# Patient Record
Sex: Female | Born: 1960
Health system: Southern US, Community
[De-identification: ages and names within clinical notes are randomized; demographics above are authoritative.]

## PROBLEM LIST (undated history)

## (undated) DIAGNOSIS — K219 Gastro-esophageal reflux disease without esophagitis: Secondary | ICD-10-CM

## (undated) DIAGNOSIS — E785 Hyperlipidemia, unspecified: Secondary | ICD-10-CM

## (undated) DIAGNOSIS — N02B9 Other recurrent and persistent immunoglobulin A nephropathy: Secondary | ICD-10-CM

## (undated) DIAGNOSIS — N028 Recurrent and persistent hematuria with other morphologic changes: Secondary | ICD-10-CM

## (undated) DIAGNOSIS — M1990 Unspecified osteoarthritis, unspecified site: Secondary | ICD-10-CM

## (undated) DIAGNOSIS — Z860100 Personal history of colon polyps, unspecified: Secondary | ICD-10-CM

## (undated) DIAGNOSIS — O24419 Gestational diabetes mellitus in pregnancy, unspecified control: Secondary | ICD-10-CM

## (undated) DIAGNOSIS — I10 Essential (primary) hypertension: Secondary | ICD-10-CM

## (undated) HISTORY — DX: Personal history of colonic polyps: Z86.010

## (undated) HISTORY — DX: Unspecified osteoarthritis, unspecified site: M19.90

## (undated) HISTORY — DX: Hyperlipidemia, unspecified: E78.5

## (undated) HISTORY — DX: Personal history of colon polyps, unspecified: Z86.0100

## (undated) HISTORY — PX: COLONOSCOPY: SHX174

## (undated) HISTORY — DX: Gastro-esophageal reflux disease without esophagitis: K21.9

## (undated) HISTORY — DX: Recurrent and persistent hematuria with other morphologic changes: N02.8

## (undated) HISTORY — DX: Other recurrent and persistent immunoglobulin A nephropathy: N02.B9

---

## 2010-06-10 ENCOUNTER — Ambulatory Visit (HOSPITAL_COMMUNITY): Admission: RE | Admit: 2010-06-10 | Discharge: 2010-06-10 | Payer: Self-pay

## 2011-09-03 ENCOUNTER — Encounter: Payer: Self-pay | Admitting: Family Medicine

## 2011-09-03 ENCOUNTER — Emergency Department (INDEPENDENT_AMBULATORY_CARE_PROVIDER_SITE_OTHER)
Admission: EM | Admit: 2011-09-03 | Discharge: 2011-09-03 | Disposition: A | Discharge status: Home / Self Care | Payer: 59 | Source: Home / Self Care | Attending: Family Medicine | Admitting: Family Medicine

## 2011-09-03 ENCOUNTER — Encounter: Payer: Self-pay | Admitting: *Deleted

## 2011-09-03 DIAGNOSIS — M940 Chondrocostal junction syndrome [Tietze]: Secondary | ICD-10-CM

## 2011-09-03 DIAGNOSIS — J069 Acute upper respiratory infection, unspecified: Secondary | ICD-10-CM

## 2011-09-03 HISTORY — DX: Gestational diabetes mellitus in pregnancy, unspecified control: O24.419

## 2011-09-03 LAB — POCT INFLUENZA A/B
Influenza A, POC: NEGATIVE
Influenza B, POC: NEGATIVE

## 2011-09-03 MED ORDER — AZITHROMYCIN 250 MG PO TABS: ORAL_TABLET | ORAL | Status: AC

## 2011-09-03 MED ORDER — BENZONATATE 200 MG PO CAPS: 200.0000 mg | ORAL_CAPSULE | Freq: Every day | ORAL | Status: AC

## 2011-09-03 NOTE — ED Notes (Signed)
Patient c/o dry cough, body aches, HA and fatigue x 2 days. Taken Mucinex and ibuprofen OTC.

## 2011-09-05 NOTE — ED Provider Notes (Signed)
History     CSN: [id number] Arrival date & time: 09/03/2011 12:25 PM   First MD Initiated Contact with Patient 09/03/11 1233      Chief Complaint  Patient presents with  . Cough  . Generalized Body Aches      HPI Comments: Patient complains of 2 day history gradually progressively URI symptoms, but has not had a sore throat  Patient is a 50 y.o. female presenting with cough. The history is provided by the patient.  Cough This is a new problem. The current episode started 2 days ago. The problem occurs every few minutes. The problem has been gradually worsening. The cough is non-productive. The maximum temperature recorded prior to her arrival was 100 to 100.9 F. Associated symptoms include chest pain, chills, sweats, headaches, rhinorrhea, myalgias and wheezing. Pertinent negatives include no ear congestion, no ear pain, no sore throat, no shortness of breath and no eye redness. She is not a smoker.    Past Medical History  Diagnosis Date  . Gestational diabetes     Past Surgical History  Procedure Date  . Cesarean section     Family History  Problem Relation Age of Onset  . Hypertension Father   . Diabetes Father   . Cancer Sister     History  Substance Use Topics  . Smoking status: Never Smoker   . Smokeless tobacco: Not on file  . Alcohol Use: No    OB History    Grav Para Term Preterm Abortions TAB SAB Ect Mult Living                  Review of Systems  Constitutional: Positive for fever, chills, appetite change and fatigue.  HENT: Positive for congestion and rhinorrhea. Negative for ear pain, sore throat, sinus pressure and ear discharge.   Eyes: Negative for redness.  Respiratory: Positive for cough, chest tightness and wheezing. Negative for shortness of breath.   Cardiovascular: Positive for chest pain.  Gastrointestinal: Negative.   Genitourinary: Negative.   Musculoskeletal: Positive for myalgias.  Skin: Negative.   Neurological: Positive for  headaches.    Allergies  Ciprofloxacin  Home Medications   Current Outpatient Rx  Name Route Sig Dispense Refill  . AZITHROMYCIN 250 MG PO TABS  Take 2 tabs today; then begin one tab once daily for 4 more days (Rx void after 09/11/11)  DEA # [id number] 6 each 0  . BENZONATATE 200 MG PO CAPS Oral Take 1 capsule (200 mg total) by mouth at bedtime. Take as needed for cough 12 capsule 0    BP 135/87  Pulse 99  Temp(Src) 98.2 F (36.8 C) (Oral)  Resp 14  Ht 5' (1.524 m)  Wt 137 lb (62.143 kg)  BMI 26.76 kg/m2  SpO2 100%  Physical Exam  Nursing note and vitals reviewed. Constitutional: She is oriented to person, place, and time. She appears well-developed and well-nourished. No distress.  HENT:  Head: Normocephalic.  Right Ear: External ear normal.  Left Ear: External ear normal.  Nose: Mucosal edema and rhinorrhea present. No sinus tenderness.  Mouth/Throat: Oropharynx is clear and moist. No oropharyngeal exudate.  Eyes: Conjunctivae and EOM are normal. Pupils are equal, round, and reactive to light. Right eye exhibits no discharge. Left eye exhibits no discharge.  Neck: Normal range of motion. Neck supple.  Cardiovascular: Normal rate, regular rhythm and normal heart sounds.   Pulmonary/Chest: Effort normal and breath sounds normal. She has no wheezes. She has no rales. She exhibits  tenderness.       Distinct tenderness to palpation over sternum  Abdominal: Soft. Bowel sounds are normal. There is no tenderness.  Musculoskeletal: She exhibits no edema.  Lymphadenopathy:    She has cervical adenopathy.       Right cervical: Posterior cervical adenopathy present.       Left cervical: Posterior cervical adenopathy present.  Neurological: She is alert and oriented to person, place, and time.  Skin: Skin is warm and dry. She is not diaphoretic.    ED Course  Procedures none   Labs Reviewed  POCT INFLUENZA A/B negative      1. Acute upper respiratory infections of  unspecified site   2. Acute costochondritis       MDM  There is no evidence of bacterial infection today.    Treat symptomatically for now:  Increase fluid intake, begin expectorant/decongestant, topical decongestant, saline nasal spray/saline irrigation, cough suppressant at bedtime. If fever/chills persist, or if not improving 5 days begin Z-pack (given Rx to hold). May take Ibuprofen 200mg , 4 tabs every 8 hours with food for sternum tenderness, etc. Followup with PCP if not improving 7 to 10 days.         Donna Christen, MD 09/05/11 1259

## 2012-04-18 ENCOUNTER — Encounter (HOSPITAL_COMMUNITY): Payer: Self-pay | Admitting: *Deleted

## 2012-04-18 ENCOUNTER — Emergency Department (HOSPITAL_COMMUNITY)
Admission: EM | Admit: 2012-04-18 | Discharge: 2012-04-18 | Disposition: A | Discharge status: Home / Self Care | Payer: 59 | Source: Home / Self Care | Attending: Emergency Medicine | Admitting: Emergency Medicine

## 2012-04-18 DIAGNOSIS — T148 Other injury of unspecified body region: Secondary | ICD-10-CM

## 2012-04-18 DIAGNOSIS — W57XXXA Bitten or stung by nonvenomous insect and other nonvenomous arthropods, initial encounter: Secondary | ICD-10-CM

## 2012-04-18 DIAGNOSIS — L089 Local infection of the skin and subcutaneous tissue, unspecified: Secondary | ICD-10-CM

## 2012-04-18 MED ORDER — DOXYCYCLINE HYCLATE 100 MG PO CAPS: 100.0000 mg | ORAL_CAPSULE | Freq: Two times a day (BID) | ORAL | Status: AC

## 2012-04-18 NOTE — ED Notes (Signed)
Pt   Reports       She   May  Have  Been  Bitted  By  Some  Fleas     About  1  Week  Ago      She  Has  A  Spot  On  Her   l    Ankle  As  Well  That  May  Have  Gotten  Infected               She  Reports   She  Has  Been  Applying  Topical        cortizone  And  Anti  Biotics        To  The  Area    Without  Improvement

## 2012-04-18 NOTE — ED Provider Notes (Signed)
Medical screening examination/treatment/procedure(s) were performed by non-physician practitioner and as supervising physician I was immediately available for consultation/collaboration.  Leslee Home, M.D.   Reuben Likes, MD 04/18/12 2121

## 2012-04-18 NOTE — ED Provider Notes (Signed)
History     CSN: [id number]  Arrival date & time 04/18/12  1719   First MD Initiated Contact with Patient 04/18/12 1751      Chief Complaint  Patient presents with  . Rash    (Consider location/radiation/quality/duration/timing/severity/associated sxs/prior treatment) Patient is a 51 y.o. female presenting with rash. The history is provided by the patient. No language interpreter was used.  Rash  This is a new problem. The current episode started more than 1 week ago. The problem has not changed since onset.The problem is associated with nothing. There has been no fever. The rash is present on the right lower leg and left lower leg. The pain is moderate. The pain has been constant since onset. Associated symptoms include itching. She has tried nothing for the symptoms.  Pt thinks she was bitten by fleas.  Pt reports she has an area that is red and swollen now.  Pt worried about spider bites  Past Medical History  Diagnosis Date  . Gestational diabetes     Past Surgical History  Procedure Date  . Cesarean section   . Cesarean section     Family History  Problem Relation Age of Onset  . Hypertension Father   . Diabetes Father   . Cancer Sister     History  Substance Use Topics  . Smoking status: Never Smoker   . Smokeless tobacco: Not on file  . Alcohol Use: No    OB History    Grav Para Term Preterm Abortions TAB SAB Ect Mult Living                  Review of Systems  Skin: Positive for itching and rash.  All other systems reviewed and are negative.    Allergies  Ciprofloxacin  Home Medications   Current Outpatient Rx  Name Route Sig Dispense Refill  . OMEPRAZOLE 40 MG PO CPDR Oral Take 40 mg by mouth daily.    Marland Kitchen DOXYCYCLINE HYCLATE 100 MG PO CAPS Oral Take 1 capsule (100 mg total) by mouth 2 (two) times daily. 20 capsule 0    BP 161/90  Pulse 86  Temp 98.1 F (36.7 C) (Oral)  Resp 18  SpO2 98%  LMP 03/25/2012  Physical Exam  Vitals  reviewed. Constitutional: She is oriented to person, place, and time. She appears well-developed and well-nourished.  Musculoskeletal: She exhibits tenderness.       Multiple small bites,  2 cm round red area left ankle,   Neurological: She is alert and oriented to person, place, and time. She has normal reflexes.  Skin: There is erythema.  Psychiatric: She has a normal mood and affect.    ED Course  Procedures (including critical care time)  Labs Reviewed - No data to display No results found.   1. Insect bite   2. Wound infection       MDM  Pt started on doxycycline,        Lonia Skinner [place], [place] 04/18/12 2033

## 2012-04-18 NOTE — ED Notes (Signed)
Pt  Reports  About  1  Week   Ago         She  May  Have  Been bitten     By  some

## 2014-08-05 DIAGNOSIS — K219 Gastro-esophageal reflux disease without esophagitis: Secondary | ICD-10-CM | POA: Insufficient documentation

## 2014-08-05 DIAGNOSIS — I10 Essential (primary) hypertension: Secondary | ICD-10-CM | POA: Insufficient documentation

## 2014-08-05 DIAGNOSIS — M159 Polyosteoarthritis, unspecified: Secondary | ICD-10-CM | POA: Insufficient documentation

## 2014-11-08 ENCOUNTER — Other Ambulatory Visit: Payer: Self-pay | Admitting: Obstetrics and Gynecology

## 2014-11-08 DIAGNOSIS — R1031 Right lower quadrant pain: Secondary | ICD-10-CM

## 2014-11-12 ENCOUNTER — Other Ambulatory Visit: Payer: 59

## 2014-11-14 ENCOUNTER — Ambulatory Visit (HOSPITAL_COMMUNITY)
Admission: RE | Admit: 2014-11-14 | Discharge: 2014-11-14 | Disposition: A | Discharge status: Home / Self Care | Payer: 59 | Source: Ambulatory Visit | Attending: Obstetrics and Gynecology | Admitting: Obstetrics and Gynecology

## 2014-11-14 ENCOUNTER — Encounter (HOSPITAL_COMMUNITY): Payer: Self-pay

## 2014-11-14 DIAGNOSIS — R1031 Right lower quadrant pain: Secondary | ICD-10-CM | POA: Diagnosis not present

## 2014-11-14 HISTORY — DX: Essential (primary) hypertension: I10

## 2014-11-14 MED ORDER — IOHEXOL 300 MG/ML  SOLN
100.0000 mL | Freq: Once | INTRAMUSCULAR | Status: AC | PRN
  Administered 2014-11-14: 100 mL via INTRAVENOUS

## 2015-04-09 ENCOUNTER — Telehealth: Payer: Self-pay

## 2015-05-01 ENCOUNTER — Other Ambulatory Visit: Payer: Self-pay | Admitting: Family Medicine

## 2015-05-01 ENCOUNTER — Other Ambulatory Visit (HOSPITAL_COMMUNITY)
Admission: RE | Admit: 2015-05-01 | Discharge: 2015-05-01 | Disposition: A | Discharge status: Home / Self Care | Payer: 59 | Source: Ambulatory Visit | Attending: Family Medicine | Admitting: Family Medicine

## 2015-05-01 DIAGNOSIS — Z01419 Encounter for gynecological examination (general) (routine) without abnormal findings: Secondary | ICD-10-CM | POA: Diagnosis not present

## 2015-05-01 DIAGNOSIS — Z1151 Encounter for screening for human papillomavirus (HPV): Secondary | ICD-10-CM | POA: Insufficient documentation

## 2015-05-02 LAB — CYTOLOGY - PAP

## 2015-05-06 ENCOUNTER — Encounter: Payer: Self-pay | Admitting: *Deleted

## 2015-05-06 ENCOUNTER — Other Ambulatory Visit: Payer: Self-pay | Admitting: *Deleted

## 2015-05-06 DIAGNOSIS — Z86010 Personal history of colonic polyps: Secondary | ICD-10-CM | POA: Insufficient documentation

## 2015-05-06 DIAGNOSIS — N028 Recurrent and persistent hematuria with other morphologic changes: Secondary | ICD-10-CM | POA: Insufficient documentation

## 2015-05-06 NOTE — Patient Outreach (Addendum)
[facility]) Care Management   05/06/2015  Rachel Hawkins Jun 29, 1961 [id number]  Rachel Hawkins is an 54 y.o. female who presents for enrollment in the Link To Wellness program for self management assistance with HTN.  Subjective:  Rachel Hawkins has no complaints. She says she is motivated to keep her blood pressure in good control but sometimes forgets to take her medications and is asking for a pill box.  Objective:   Review of Systems  Constitutional: Negative.     Physical Exam  Skin: Skin is warm and dry.  Psychiatric: She has a normal mood and affect. Her behavior is normal. Thought content normal.   Filed Weights   05/06/15 0956  Weight: 137 lb 6.4 oz (62.324 kg)   Filed Vitals:   05/06/15 0956  BP: 110/78  Pulse: 80    Current Medications:   Current Outpatient Prescriptions  Medication Sig Dispense Refill  . lisinopril (PRINIVIL,ZESTRIL) 2.5 MG tablet Take 10 mg by mouth daily.     . norethindrone-ethinyl estradiol (JUNEL FE,GILDESS FE,LOESTRIN FE) 1-20 MG-MCG tablet Take 1 tablet by mouth daily.    Marland Kitchen omeprazole (PRILOSEC) 40 MG capsule Take 40 mg by mouth daily.     No current facility-administered medications for this visit.    Functional Status:   In your present state of health, do you have any difficulty performing the following activities: 05/06/2015  Hearing? N  Vision? N  Difficulty concentrating or making decisions? N  Walking or climbing stairs? N  Dressing or bathing? N  Doing errands, shopping? N    Fall/Depression Screening:    PHQ 2/9 Scores 05/06/2015  PHQ - 2 Score 0   THN CM Care Plan Problem One        Patient Outreach from 05/06/2015 in [place] Problem One  HTN with good control ase evidenced by currently meeting treatment target of <140<90   Care Plan for Problem One  Active   THN Long Term Goal (31-90 days)  Ongoing good control of HTN as evidenced by 75% of home readings and  health care  provider readings meeting target of <140/<90   THN Long Term Goal Start Date  05/06/15   Interventions for Problem One Long Term Goal  provided HTN information packet including BP log sheets, reviewed procedure for home BP monitoring, reviewed heart healthy diet, encouraged Rachel Hawkins to continue her daily walks, reviewed nutritional counseling benefit provided by [place] and encouraged patient to see dietician to assist with dietary management of HTN, provided pill box at no charge to assist with HTN medication adherence, reviewed HTN medication of Lisinopril and discussed mechanism of action, dosage and frequency and common side effects, assigned Emmi education modules related to HTN and home BP monitoring, encouraged patient to view by completion date and to incorporate information gained through the modules to assist with HTN self -management, encouraged patient's ongoing  participation in the [place] [employer]y Rewards Program to assist with healthy lifestyle changes  and to be eligible for cash rewards,  arranged for Link To Wellness follow up  in 6  months    [facility] CM Care Plan Problem Two        Patient Outreach from 05/06/2015 in [place] Problem Two  Abnormal lipid profile as evidenced by elevated triglycerides = 213, and HDL low at 46   Care Plan for Problem Two  Active  Interventions for Problem Two Long Term Goal   reviewed lipid profile drawn 04/30/15, provided information on heart healthy diet and strategies to increase HDL and lower triglycerides, assigned EMMI  appropriate Emmi education modules   THN Long Term Goal (31-90) days  Improved lipid profile as evidenced by triglycerides <200 and HDL >55 at next assessment and pt voices good understanding of heart healthy diet   THN Long Term Goal Start Date  05/06/15     Assessment:  [employer] employee and new Link To Wellness member with HTN meeting treatment targets and with  mildly elevated triglycerides and low HDL.  Plan:  RNCM to fax today's office visit note to Dr. Nancy Fetter. [facility] will meet twice yearly and as needed with patient per Link To Wellness program guidelines to assist with HTN and dyslipidemia self-management and assess patient's progress toward mutually set goals. Barrington Ellison RN,CCM,CDE [place] Management Coordinator Link To Wellness Office Phone [phone] Office Fax [phone]

## 2015-05-07 ENCOUNTER — Encounter: Payer: Self-pay | Admitting: *Deleted

## 2015-05-14 ENCOUNTER — Ambulatory Visit: Payer: Self-pay

## 2015-08-08 ENCOUNTER — Other Ambulatory Visit: Payer: Self-pay | Admitting: *Deleted

## 2015-08-08 NOTE — Patient Outreach (Signed)
Sent the following secure e-mail to [facility] e-mail address: Delight Stare, I hope all is well with you. I wanted to check in with you and see how you were doing with your BP management. Please email me a quick update when you can and include the following: frequency of home BP checks, highest and lowest readings in the last month or so, the average of your readings- (you can get that off of the monitor),  any changes in the medications that you are taking and any changes in you or your family's medical history since our  meeting on 8/1.  Please contact me for any questions or concerns you have. As discussed in our initial meeting, I will contact you about a follow up appointment in February or sooner if you need it. Thanks in advance for this information. Marcie Bal Await response. Barrington Ellison RN,CCM,CDE [place] Management Coordinator Link To Wellness Office Phone [phone] Office Fax [phone]

## 2015-08-13 ENCOUNTER — Other Ambulatory Visit: Payer: Self-pay | Admitting: *Deleted

## 2015-08-13 NOTE — Patient Outreach (Signed)
No response from e-mail sent to patient one week ago requesting update of HTN self management . She was seen for initial Link To Wellness visit on  05/06/2015 Called and left message on home phone as patient does not have a cell phone again requesting update. Await return call. Barrington Ellison RN,CCM,CDE [place] Management Coordinator Link To Wellness Office Phone [phone] Office Fax [phone]

## 2015-08-15 ENCOUNTER — Other Ambulatory Visit: Payer: Self-pay | Admitting: *Deleted

## 2015-08-15 NOTE — Patient Outreach (Signed)
Received return phone call from [place]. She reports home BP reading variance of 137-150/82-94. She says she feels the [employer] home BP monitor reads higher than when her BP is taken manually. She states she is adherent with her medications. She is voicing concern that her resting pulse is in the low 100's at times per the monitor. Discussed limiting caffeine as she admits to consuming a significant amount of caffeine and assess whether this resolves the rapid pulse issue. She says her HTN treatment regimen has not changed and she will see Dr. Nancy Fetter next week. This RNCM suggested she take her home BP monitor to the office so they can check its correlation to the manual check. Vicenta Dunning that this RNCM will contact her in February to arrange routine Link To Wellness follow up.  Barrington Ellison RN,CCM,CDE [place] Management Coordinator Link To Wellness Office Phone [phone] Office Fax [phone]

## 2015-10-22 MED FILL — OMEPRAZOLE DR 40 MG CAPSULE: 40 | 30 days supply | Qty: 30 | Fill #7

## 2015-11-08 ENCOUNTER — Other Ambulatory Visit: Payer: Self-pay | Admitting: *Deleted

## 2015-11-08 NOTE — Patient Outreach (Signed)
Luci Bank a secure e-mail to her [facility] e-mail address requesting she schedule a Link To Wellness follow up appointment to assess HTN self management. Barrington Ellison RN,CCM,CDE [place] Management Coordinator Link To Wellness Office Phone [phone] Office Fax [phone]

## 2015-11-15 ENCOUNTER — Other Ambulatory Visit: Payer: Self-pay | Admitting: *Deleted

## 2015-11-15 NOTE — Patient Outreach (Signed)
Received the following e-mail response from  [facility] regarding HTN self management  "Good Morning, I've been on the run a lot lately  sorry I haven't gotten back with you. I'm going to have my blood pressure & blood sugar tested when [facility] Care Management comes to our office Feb 27th. Is that o k or if I need to come in I will."    Will plan to review screening blood pressure and blood sugar when she is screened by [facility] CM on 2/27 at her work site to determine if Link To Wellness follow up is necessary.  Barrington Ellison RN,CCM,CDE [place] Management Coordinator Link To Wellness Office Phone [phone] Office Fax [phone]

## 2015-11-20 MED FILL — OMEPRAZOLE DR 40 MG CAPSULE: 40 | 30 days supply | Qty: 30 | Fill #8

## 2015-11-20 MED FILL — NORETHIN-ESTRAD-FERR 1-0.02: 1-20 | 84 days supply | Qty: 84 | Fill #1

## 2015-12-05 DIAGNOSIS — J40 Bronchitis, not specified as acute or chronic: Secondary | ICD-10-CM | POA: Insufficient documentation

## 2015-12-06 MED FILL — AZITHROMYCIN 250 MG TABLET: 250 | 5 days supply | Qty: 5 | Fill #0

## 2015-12-12 DIAGNOSIS — H40013 Open angle with borderline findings, low risk, bilateral: Secondary | ICD-10-CM | POA: Diagnosis not present

## 2015-12-12 DIAGNOSIS — H53453 Other localized visual field defect, bilateral: Secondary | ICD-10-CM | POA: Diagnosis not present

## 2015-12-12 DIAGNOSIS — H43812 Vitreous degeneration, left eye: Secondary | ICD-10-CM | POA: Diagnosis not present

## 2015-12-25 MED FILL — OMEPRAZOLE DR 40 MG CAPSULE: 40 | 30 days supply | Qty: 30 | Fill #9

## 2015-12-25 MED FILL — LISINOPRIL 10 MG TABLET: 10 | 90 days supply | Qty: 90 | Fill #1

## 2016-01-31 MED FILL — OMEPRAZOLE DR 40 MG CAPSULE: 40 | 90 days supply | Qty: 90 | Fill #0

## 2016-02-24 DIAGNOSIS — L237 Allergic contact dermatitis due to plants, except food: Secondary | ICD-10-CM | POA: Diagnosis not present

## 2016-02-24 MED FILL — HYDROCORTISONE 1% CREAM: 1 | 14 days supply | Qty: 28 | Fill #0

## 2016-02-24 MED FILL — NORETHIN-ESTRAD-FERR 1-0.02: 1-20 | 84 days supply | Qty: 84 | Fill #2

## 2016-04-17 MED FILL — LISINOPRIL 10 MG TABLET: 10 | 90 days supply | Qty: 90 | Fill #2

## 2016-05-22 MED FILL — NORETHIN-ESTRAD-FERR 1-0.02: 1-20 | 84 days supply | Qty: 84 | Fill #0

## 2016-06-03 MED FILL — OMEPRAZOLE DR 40 MG CAPSULE: 40 | 90 days supply | Qty: 90 | Fill #0

## 2016-06-05 ENCOUNTER — Encounter: Payer: Self-pay | Admitting: Pharmacist

## 2016-06-05 ENCOUNTER — Other Ambulatory Visit: Payer: Self-pay | Admitting: Pharmacist

## 2016-06-05 DIAGNOSIS — Z1231 Encounter for screening mammogram for malignant neoplasm of breast: Secondary | ICD-10-CM | POA: Diagnosis not present

## 2016-06-05 NOTE — Patient Outreach (Signed)
[facility]) Care Management  [place]   06/05/2016  Rachel Hawkins 08-04-1961 [id number]  Subjective: Patient presents today for hypertension follow-up as part of the employer-sponsored Link to Wellness program.  Current hypertension regimen includes lisinopril. Most recent MD follow-up was with Dr Nancy Fetter on 05/01/15.  Patient has a pending appt for ~07/2016.  No med changes or major health changes at this time.   States her mom had a stroke yesterday so she has been stressed out.  She complains since she has been typing more she has some edema in her left arm which sometimes causes her some pain. She takes ibuprofen as needed and arthritis topical cream as needed  Reports most home blood pressure readings being in the 130/80s. Denies dizziness or falls.    Patient reported dietary habits:  Has stopped french fries and only drinks coffee or water on most days.   Patient reported exercise habits: Walks 3-5 days a week for 20-30 minutes and also walks dog for 40 minutes per day. Previously was doing aerobics at home but school started back and she hasn't had time.    Objective:   Vitals:   06/05/16 1350  BP: (!) 150/100  Pulse: 78      Encounter Medications: Outpatient Encounter Prescriptions as of 06/05/2016  Medication Sig  . lisinopril (PRINIVIL,ZESTRIL) 10 MG tablet Take 10 mg by mouth daily.  . norethindrone-ethinyl estradiol (JUNEL FE,GILDESS FE,LOESTRIN FE) 1-20 MG-MCG tablet Take 1 tablet by mouth daily.  Marland Kitchen omeprazole (PRILOSEC) 40 MG capsule Take 40 mg by mouth daily.  . [DISCONTINUED] lisinopril (PRINIVIL,ZESTRIL) 2.5 MG tablet Take 10 mg by mouth daily.    No facility-administered encounter medications on file as of 06/05/2016.     Functional Status: In your present state of health, do you have any difficulty performing the following activities: 06/05/2016  Hearing? N  Vision? N  Difficulty concentrating or making decisions? N  Walking or  climbing stairs? N  Dressing or bathing? N  Doing errands, shopping? N  Some recent data might be hidden    Fall/Depression Screening: PHQ 2/9 Scores 06/05/2016 05/06/2015  PHQ - 2 Score 0 0    Assessment: Hypertension: BP above goal of <140/90 in clinic today but likely due to stress from her mother having a stroke. Home blood pressures are mostly <140/90 per patient report.  Plan: -Discussed heart healthy diet and importance of exercise for cardiovascular health.  -Counseled on medication adherence with her blood pressure medications -Instructed patient to start checking her home blood pressure more consistently at home.  -Recommended that she take Tylenol instead of Ibuprofen as needed for occasional pain due to drug interaction with lisinopril  -Recommended that patient continue to keep appointment with primary care provider next month  Followup with Kelli Churn, RN   Bennye Alm, PharmD [facility] PGY2 Pharmacy Resident [phone]  [facility] CM Care Plan Problem One   Flowsheet Row Most Recent Value  Care Plan Problem One  HTN with good control ase evidenced by currently meeting treatment target of <140<90  Role Documenting the Problem One  Clinical Pharmacist  Care Plan for Problem One  Active  THN Long Term Goal (31-90 days)  Patient will check her blood pressure a couple times a week for the next 90 days  THN Long Term Goal Start Date  08/15/15  Interventions for Problem One Long Term Goal  discussed importance of adherence with her medications and checking her blood pressure at home to have  a more accurate assesment of control.     [facility] CM Care Plan Problem Two   Flowsheet Row Most Recent Value  Care Plan Problem Two  Abnormal lipid profile as evidenced by elevated triglycerides = 213, and HDL low at 46  Role Documenting the Problem Two  Care Management Coordinator  Care Plan for Problem Two  Active  Interventions for Problem Two Long Term Goal   reviewed lipie profile drawn  04/30/15, provided information on heart healthy diet and strategies to increase HDl and lower trigglycerides, assigned EMMI  appropriate Emmi education modules  THN Long Term Goal (31-90) days  Improved lipid profile as evidenced by triglycerides <200 and HDL >55 at next assessment and pt voices good understanding of heart healthy diet  THN Long Term Goal Start Date  05/06/15

## 2016-07-04 DIAGNOSIS — S81011A Laceration without foreign body, right knee, initial encounter: Secondary | ICD-10-CM | POA: Diagnosis not present

## 2016-07-04 DIAGNOSIS — S8001XA Contusion of right knee, initial encounter: Secondary | ICD-10-CM | POA: Diagnosis not present

## 2016-07-23 DIAGNOSIS — M7989 Other specified soft tissue disorders: Secondary | ICD-10-CM | POA: Diagnosis not present

## 2016-07-23 DIAGNOSIS — Z1231 Encounter for screening mammogram for malignant neoplasm of breast: Secondary | ICD-10-CM | POA: Diagnosis not present

## 2016-07-23 DIAGNOSIS — M25461 Effusion, right knee: Secondary | ICD-10-CM | POA: Diagnosis not present

## 2016-07-23 DIAGNOSIS — S8001XA Contusion of right knee, initial encounter: Secondary | ICD-10-CM | POA: Diagnosis not present

## 2016-07-24 DIAGNOSIS — M7989 Other specified soft tissue disorders: Secondary | ICD-10-CM | POA: Diagnosis not present

## 2016-07-27 MED FILL — LISINOPRIL 10 MG TABLET: 10 | 90 days supply | Qty: 90 | Fill #0

## 2016-08-10 DIAGNOSIS — I10 Essential (primary) hypertension: Secondary | ICD-10-CM | POA: Diagnosis not present

## 2016-08-10 DIAGNOSIS — N028 Recurrent and persistent hematuria with other morphologic changes: Secondary | ICD-10-CM | POA: Diagnosis not present

## 2016-08-10 DIAGNOSIS — K219 Gastro-esophageal reflux disease without esophagitis: Secondary | ICD-10-CM | POA: Diagnosis not present

## 2016-08-10 DIAGNOSIS — Z0000 Encounter for general adult medical examination without abnormal findings: Secondary | ICD-10-CM | POA: Diagnosis not present

## 2016-09-25 MED FILL — OMEPRAZOLE DR 40 MG CAPSULE: 40 | 90 days supply | Qty: 90 | Fill #0

## 2016-10-27 DIAGNOSIS — H43812 Vitreous degeneration, left eye: Secondary | ICD-10-CM | POA: Diagnosis not present

## 2016-10-27 DIAGNOSIS — H04123 Dry eye syndrome of bilateral lacrimal glands: Secondary | ICD-10-CM | POA: Diagnosis not present

## 2016-10-27 DIAGNOSIS — H53453 Other localized visual field defect, bilateral: Secondary | ICD-10-CM | POA: Diagnosis not present

## 2016-10-27 DIAGNOSIS — H40013 Open angle with borderline findings, low risk, bilateral: Secondary | ICD-10-CM | POA: Diagnosis not present

## 2016-11-10 MED FILL — LISINOPRIL 10 MG TABLET: 10 | 90 days supply | Qty: 90 | Fill #0

## 2016-11-11 DIAGNOSIS — N390 Urinary tract infection, site not specified: Secondary | ICD-10-CM | POA: Diagnosis not present

## 2016-11-11 MED FILL — SULFAMETHOXAZOLE/TMP DS TAB: 800-160 | 5 days supply | Qty: 10 | Fill #0

## 2017-01-07 ENCOUNTER — Ambulatory Visit: Payer: 59 | Admitting: *Deleted

## 2017-01-07 ENCOUNTER — Ambulatory Visit: Payer: Self-pay | Admitting: *Deleted

## 2017-01-13 DIAGNOSIS — N390 Urinary tract infection, site not specified: Secondary | ICD-10-CM | POA: Diagnosis not present

## 2017-01-13 MED FILL — NITROFURANTOIN MONO-MCR 100: 100 | 7 days supply | Qty: 14 | Fill #0

## 2017-01-15 MED FILL — SULFAMETHOXAZOLE/TMP DS TAB: 800-160 | 5 days supply | Qty: 10 | Fill #0

## 2017-01-21 ENCOUNTER — Other Ambulatory Visit: Payer: Self-pay | Admitting: *Deleted

## 2017-01-22 ENCOUNTER — Encounter: Payer: Self-pay | Admitting: *Deleted

## 2017-01-22 NOTE — Patient Outreach (Signed)
[facility]) Care Management  Rachel Hawkins   01/21/2017  Rachel Hawkins Aug 18, 1961 [id number]  Subjective: Patient presents today for hypertension follow-up as part of the employer-sponsored Link to Wellness program.  Current hypertension regimen includes lisinopril. Most recent MD follow-up was with Dr Nancy Fetter on 11/11/16 for c/o abdominal pain. .  Patient has wellness exam scheduled for 08/11/17 .  No med changes or major health changes at this time.   She takes ibuprofen as needed and arthritis topical cream as needed for arthritis pain  She brings her Omron home blood pressure monitor with her. She reports intermittent episodes of bilateral arm heaviness and says it's usually associated with a low blood pressure reading. She says she gets a headache when her blood pressure is high.     Patient reported exercise habits: Walks 3-5 days a week for 20-30 minutes and also walks dog for 40 minutes per day.    Objective:   Vitals:   01/21/17 1630  BP: 102/78  Pulse: 84   Review of Omron home blood pressure monitor shows average blood pressure of 103/77, variance of 77-151/58-88.   Encounter Medications: Outpatient Encounter Prescriptions as of 01/21/2017  Medication Sig Note  . lisinopril (PRINIVIL,ZESTRIL) 10 MG tablet Take 10 mg by mouth daily.   Marland Kitchen ibuprofen (ADVIL,MOTRIN) 200 MG tablet Take 400 mg by mouth every 6 (six) hours as needed.    No facility-administered encounter medications on file as of 01/21/2017.     Functional Status: In your present state of health, do you have any difficulty performing the following activities: 01/21/2017 06/05/2016  Hearing? N N  Vision? N N  Difficulty concentrating or making decisions? N N  Walking or climbing stairs? N N  Dressing or bathing? N N  Doing errands, shopping? N N  Preparing Food and eating ? N -  Using the Toilet? N -  In the past six months, have you accidently leaked urine? N -  Do you have problems  with loss of bowel control? N -  Managing your Medications? N -  Managing your Finances? N -  Housekeeping or managing your Housekeeping? N -  Some recent data might be hidden    Fall/Depression Screening: PHQ 2/9 Scores 01/21/2017 06/05/2016 05/06/2015  PHQ - 2 Score 0 0 0    Assessment: [place] [employer]  employee with hypertension meeting blood pressure goals of <140/<90. Lipi profile shows elevated triglycerides and Low HDL.     THN CM Care Plan Problem One     Most Recent Value  Care Plan Problem One  HTN with good control as evidenced by average home BP readings meeting treatment target of <140<90  Role Documenting the Problem One Care Management [place] for Problem One  Active  THN Long Term Goal (31-90 days)  Patient will check her blood pressure at least once weekly and when she doesn't feel well and report high or low BP trends to her provider   [facility] Long Term Goal Start Date 01/21/17  Interventions for Problem One Long Term Goal  reviewed medications and medication adherence, discussed importance of adherence with her medications and checking her blood pressure at home to have a more accurate assessment of control, reviewed guidelines that would require MD notification    [facility] CM Care Plan Problem Two     Most Recent Value  Care Plan Problem Two  Abnormal lipid profile as evidenced by elevated triglycerides = 251, and HDL low at 46  Role Documenting the Problem Two Care Management [place] for Problem Two Active  Interventions for Problem Two Long Term Goal  reviewed lipid profile drawn 08/10/16, reviewed information on heart healthy diet and strategies to increase HDL and lower triglycerides  THN Long Term Goal (31-90) days Improved lipid profile as evidenced by triglycerides <200 and HDL >55 at next assessment and pt voices good understanding of heart healthy diet  THN Long Term Goal Start Date 01/21/17     RNCM to fax today's office visit note to Dr.  Nancy Fetter. [facility] will meet twice yearly and as needed with patient per Link To Wellness program guidelines to assist with HTN and dyslipidemia self-management and assess patient's progress toward mutually set goals. Barrington Ellison RN,CCM,CDE [place] Management Coordinator Link To Wellness Office Phone [phone] Office Fax [phone]

## 2017-03-19 MED FILL — LISINOPRIL 10 MG TABLET: 10 | 90 days supply | Qty: 90 | Fill #1

## 2017-06-23 DIAGNOSIS — Z1231 Encounter for screening mammogram for malignant neoplasm of breast: Secondary | ICD-10-CM | POA: Diagnosis not present

## 2017-07-08 DIAGNOSIS — I10 Essential (primary) hypertension: Secondary | ICD-10-CM | POA: Diagnosis not present

## 2017-07-08 DIAGNOSIS — R42 Dizziness and giddiness: Secondary | ICD-10-CM | POA: Diagnosis not present

## 2017-07-08 DIAGNOSIS — R5383 Other fatigue: Secondary | ICD-10-CM | POA: Diagnosis not present

## 2017-07-08 DIAGNOSIS — R350 Frequency of micturition: Secondary | ICD-10-CM | POA: Diagnosis not present

## 2017-07-08 DIAGNOSIS — R319 Hematuria, unspecified: Secondary | ICD-10-CM | POA: Diagnosis not present

## 2017-07-08 MED FILL — SULFAMETHOXAZOLE/TMP DS TAB: 800-160 | 5 days supply | Qty: 10 | Fill #0

## 2017-07-16 ENCOUNTER — Other Ambulatory Visit: Payer: Self-pay | Admitting: *Deleted

## 2017-07-16 NOTE — Patient Outreach (Addendum)
[facility]) Care Management  [place]   07/16/2017  Rachel Hawkins 12/18/1960 [id number]  Subjective: Patient was seen at the [employer] today for hypertension follow-up as part of the employer-sponsored Link to Wellness program. Most recent MD follow-up was with Marilynne Drivers PA  for c/o dizziness and urinary frequency. There was a moderate amount of blood in her urine but the culture was negative, provider attributes blood in urine to IgA nephropathy. Her Lisinopril dosage was reduced due to c/o dizziness and low normal blood pressure readings.   Patient has wellness exam scheduled for 08/11/17.   She takes ibuprofen as needed and arthritis topical cream as needed for arthritis pain  She brings her Omron home blood pressure monitor with her.      Patient reported exercise habits: Walks 3-5 days a week for 20-30 minutes and also walks dog for 40 minutes per day.    Objective:  BP taken at the [place] [facility]= 120/64. Reviewed Omron home monitor blood pressure readings. There were several readings <90/<60 and no readings >140/>90. Marland Kitchen   Encounter Medications: Outpatient Encounter Prescriptions as of 01/21/2017  Medication Sig Note  . lisinopril (PRINIVIL,ZESTRIL) 10 MG tablet Take 10 mg by mouth daily.   Marland Kitchen ibuprofen (ADVIL,MOTRIN) 200 MG tablet Take 400 mg by mouth every 6 (six) hours as needed.    No facility-administered encounter medications on file as of 01/21/2017.     Functional Status: In your present state of health, do you have any difficulty performing the following activities: 01/21/2017  Hearing? N  Vision? N  Difficulty concentrating or making decisions? N  Walking or climbing stairs? N  Dressing or bathing? N  Doing errands, shopping? N  Preparing Food and eating ? N  Using the Toilet? N  In the past six months, have you accidently leaked urine? N  Do you have problems with loss of bowel control? N  Managing your  Medications? N  Managing your Finances? N  Housekeeping or managing your Housekeeping? N  Some recent data might be hidden    Fall/Depression Screening: PHQ 2/9 Scores 01/21/2017 06/05/2016 05/06/2015  PHQ - 2 Score 0 0 0    Assessment: [employer]  employee with hypertension meeting blood pressure goals of <140/<90. Medication dose recently reduced secondary to low normal blood pressure readings with accompanying dizziness. . Lipid profile shows elevated triglycerides and Low HDL.     THN CM Care Plan Problem One     Most Recent Value  Care Plan Problem One  HTN with good control as evidenced by currently meeting treatment target of <140<90  Role Documenting the Problem One  Care Management [place] for Problem One  Active  [facility] Long Term Goal   Patient will check her blood pressure a couple times a week for the next 90 days  THN Long Term Goal Start Date  08/15/15  Interventions for Problem One Long Term Goal  discussed importance of adherence with her medications and checking her blood pressure at home to have a more accurate assessment of control.     THN CM Care Plan Problem Two     Most Recent Value  Care Plan Problem Two  Abnormal lipid profile as evidenced by elevated triglycerides = 251, and HDL low at 46  Role Documenting the Problem Two  Care Management [place] for Problem Two  Active  Interventions for Problem Two Long Term Goal  reviewed lipid profile drawn 08/10/16, reviewed information on heart healthy diet and strategies to increase HDL and lower triglycerides  THN Long Term Goal  Improved lipid profile as evidenced by triglycerides <200 and HDL >55 at next assessment and pt voices good understanding of heart healthy diet  THN Long Term Goal Start Date  05/06/15    [facility] CM Care Plan Problem Three     Most Recent Value  Care Plan Problem Three  Impaired fasting  glucose on labs done accompanied by symptoms of UTI  Role Documenting the Problem  Three  Care Management Coordinator  Care Plan for Problem Three  Active  THN CM Short Term Goal #1   Sharyn Lull will request Hgb A1C from provider and if indicative of prediabetes - she will enroll in the prediabetes [facility] program  [facility] CM Short Term Goal #1 Start Date  07/16/17  Interventions for Short Term Goal #1 reviewed results of CMET drawn on 10/4 and sent secure e-mail to [facility] advising her of normal values for fasting and nonfasting glucose, suggested she ask her provider to check a Hgb A1C  and report results to this RNCM     RNCM to fax today's office visit note to Dr. Nancy Fetter. [facility] will meet twice yearly and as needed with patient per Link To Wellness program guidelines to assist with HTN and dyslipidemia self-management and assess patient's progress toward mutually set goals. Barrington Ellison RN,CCM,CDE [place] Management Coordinator Link To Wellness Office Phone [phone] Office Fax [phone]

## 2017-08-11 DIAGNOSIS — E559 Vitamin D deficiency, unspecified: Secondary | ICD-10-CM | POA: Diagnosis not present

## 2017-08-11 DIAGNOSIS — R7301 Impaired fasting glucose: Secondary | ICD-10-CM | POA: Diagnosis not present

## 2017-08-11 DIAGNOSIS — Z0000 Encounter for general adult medical examination without abnormal findings: Secondary | ICD-10-CM | POA: Diagnosis not present

## 2017-08-11 DIAGNOSIS — K219 Gastro-esophageal reflux disease without esophagitis: Secondary | ICD-10-CM | POA: Diagnosis not present

## 2017-08-11 DIAGNOSIS — I10 Essential (primary) hypertension: Secondary | ICD-10-CM | POA: Diagnosis not present

## 2017-08-11 DIAGNOSIS — Z1159 Encounter for screening for other viral diseases: Secondary | ICD-10-CM | POA: Diagnosis not present

## 2017-08-11 DIAGNOSIS — N028 Recurrent and persistent hematuria with other morphologic changes: Secondary | ICD-10-CM | POA: Diagnosis not present

## 2017-08-12 ENCOUNTER — Other Ambulatory Visit: Payer: Self-pay | Admitting: *Deleted

## 2017-08-12 MED FILL — SIMVASTATIN 20 MG TABLET: 20 | 30 days supply | Qty: 30 | Fill #0

## 2017-08-12 MED FILL — VIT D2 1.25 MG (50,000 UNIT: 1.25 MG | 28 days supply | Qty: 4 | Fill #0

## 2017-08-12 NOTE — Patient Outreach (Signed)
Received e-mail from Rachel Hawkins stating she saw her provider yesterday and was started on a statin because her cholesterol remains high and she was told she has prediabetes. She requested information about strategies to lower cholesterol and prevent progression of prediabetes to type II Diabetes. This RNCM emailed her an information packet on controlling cholesterol and suggested she view [facility]i education modules on prediabetes and high cholesterol. Also advise Rachel Hawkins that ongoing disease self management assistance will be provided by [facility] or [facility] beginning 10/05/17 and that she will be receiving a letter in the mail with details regarding the transition.  Rachel Hawkins expressed her appreciation to this RNCM for the help provided to her through the Link To Wellness program.. Barrington Ellison RN,CCM,CDE [place] Management Coordinator Link To Wellness and [employer] [phone] Office Fax [phone]

## 2017-09-03 MED FILL — VIT D2 1.25 MG (50,000 UNIT: 1.25 MG | 28 days supply | Qty: 4 | Fill #1

## 2017-09-10 MED FILL — SIMVASTATIN 20 MG TABLET: 20 | 30 days supply | Qty: 30 | Fill #1

## 2017-09-17 MED FILL — LISINOPRIL 10 MG TABLET: 10 | 90 days supply | Qty: 45 | Fill #0

## 2017-10-06 MED FILL — OMEPRAZOLE DR 40 MG CAPSULE: 40 | 90 days supply | Qty: 90 | Fill #0

## 2017-10-19 MED FILL — SIMVASTATIN 20 MG TABLET: 20 | 90 days supply | Qty: 90 | Fill #0

## 2017-11-25 ENCOUNTER — Telehealth: Payer: Self-pay | Admitting: Internal Medicine

## 2017-11-25 NOTE — Telephone Encounter (Signed)
Received referral for patient to be seen for change in BM and recall colon. Last colon was @[facility] in [facility]. Patient not requesting certain. Previous records faxed and placed on DOD AM for 2.1.19; Dr.Pyrtle's desk for review.

## 2017-12-10 NOTE — Telephone Encounter (Signed)
Dr. Hilarie Fredrickson reviewed records and has accepted patient. Ok to schedule Direct Colon. Patient is requesting a Friday morning in June. Will call patient when that schedule is available.

## 2017-12-21 ENCOUNTER — Encounter: Payer: Self-pay | Admitting: Internal Medicine

## 2017-12-31 MED FILL — LISINOPRIL 10 MG TABLET: 10 | 90 days supply | Qty: 45 | Fill #1

## 2018-02-03 MED FILL — SIMVASTATIN 20 MG TABLET: 20 | 90 days supply | Qty: 90 | Fill #1

## 2018-03-07 NOTE — Telephone Encounter (Signed)
Patient cancelled appointments. Records will be in "records reviewed" folder. °

## 2018-03-15 MED FILL — OMEPRAZOLE 40 MG CPDR: 40 | 90 days supply | Qty: 90 | Fill #1

## 2018-03-18 ENCOUNTER — Encounter: Payer: 59 | Admitting: Internal Medicine

## 2018-03-25 ENCOUNTER — Encounter: Payer: 59 | Admitting: Internal Medicine

## 2018-04-15 MED FILL — LISINOPRIL 10 MG TABLET: 10 | 90 days supply | Qty: 45 | Fill #2

## 2018-05-16 MED FILL — SULFAMETHOXAZOLE-TMP DS TAB: 800-160 | 5 days supply | Qty: 10 | Fill #0

## 2018-05-24 MED FILL — SIMVASTATIN 20 MG TABLET: 20 | 90 days supply | Qty: 90 | Fill #2

## 2018-07-26 MED FILL — LISINOPRIL 10 MG TABLET: 10 | 90 days supply | Qty: 45 | Fill #3

## 2018-08-05 MED FILL — AMOXICILLIN 500 MG CAPSULE: 500 | 14 days supply | Qty: 28 | Fill #0

## 2018-09-12 ENCOUNTER — Ambulatory Visit (HOSPITAL_COMMUNITY)
Admission: EM | Admit: 2018-09-12 | Discharge: 2018-09-12 | Disposition: A | Discharge status: Home / Self Care | Payer: No Typology Code available for payment source | Attending: Family Medicine | Admitting: Family Medicine

## 2018-09-12 ENCOUNTER — Encounter (HOSPITAL_COMMUNITY): Payer: Self-pay

## 2018-09-12 ENCOUNTER — Other Ambulatory Visit: Payer: Self-pay

## 2018-09-12 DIAGNOSIS — K529 Noninfective gastroenteritis and colitis, unspecified: Secondary | ICD-10-CM

## 2018-09-12 DIAGNOSIS — R112 Nausea with vomiting, unspecified: Secondary | ICD-10-CM

## 2018-09-12 DIAGNOSIS — K5289 Other specified noninfective gastroenteritis and colitis: Secondary | ICD-10-CM

## 2018-09-12 DIAGNOSIS — R197 Diarrhea, unspecified: Principal | ICD-10-CM

## 2018-09-12 MED ORDER — ONDANSETRON 4 MG PO TBDP
4.0000 mg | ORAL_TABLET | Freq: Once | ORAL | Status: AC
  Administered 2018-09-12: 4 mg via ORAL

## 2018-09-12 MED ORDER — ONDANSETRON 4 MG PO TBDP
ORAL_TABLET | ORAL | Status: AC
  Filled 2018-09-12: qty 1

## 2018-09-12 MED ORDER — ONDANSETRON HCL 4 MG PO TABS: 4.0000 mg | ORAL_TABLET | Freq: Four times a day (QID) | ORAL | 0 refills | Status: DC

## 2018-09-12 MED FILL — ONDANSETRON HCL 4 MG TABLET: 4 | 3 days supply | Qty: 12 | Fill #0

## 2018-09-12 NOTE — ED Provider Notes (Signed)
[facility]   [id number] 09/12/18 Arrival Time: 0806  CC: N/V/D  SUBJECTIVE:  Rachel Hawkins is a 57 y.o. female who presents with complaint of nausea, NB/NB emesis and diarrhea that began 24 hours ago.  Recalls eating "bad cheese" last night.  Admits to generalized abdominal cramping associated with the vomiting and bouts of diarrhea.  Has not tried OTC medications.  Has not tried eating food, but tolerating liquids by mouth without difficulty.  Reports similar symptoms in the past. Complains of chills, fatigue, and decreased appetite.    Denies chest pain, SOB, constipation, hematochezia, melena, dysuria, difficulty urinating, increased frequency or urgency, flank pain, loss of bowel or bladder function.   No LMP recorded. (Menstrual status: Perimenopausal).  ROS: As per HPI.  Past Medical History:  Diagnosis Date  . Gestational diabetes   . History of colon polyps   . Hypertension   . IgA nephropathy determined by renal biopsy   . IgA nephropathy determined by renal biopsy   . IgA nephropathy with benign hematuria   . IgA nephropathy with benign hematuria    Past Surgical History:  Procedure Laterality Date  . CESAREAN SECTION    . CESAREAN SECTION     Allergies  Allergen Reactions  . Ciprofloxacin Nausea And Vomiting   No current facility-administered medications on file prior to encounter.    Current Outpatient Medications on File Prior to Encounter  Medication Sig Dispense Refill  . Cholecalciferol (VITAMIN D3) 5000 units CAPS Take 5,000 Units by mouth daily.    Marland Kitchen ibuprofen (ADVIL,MOTRIN) 200 MG tablet Take 400 mg by mouth every 6 (six) hours as needed.    Marland Kitchen lisinopril (PRINIVIL,ZESTRIL) 10 MG tablet Take 10 mg by mouth daily.    . norethindrone-ethinyl estradiol (JUNEL FE,GILDESS FE,LOESTRIN FE) 1-20 MG-MCG tablet Take 1 tablet by mouth daily.    Marland Kitchen omeprazole (PRILOSEC) 40 MG capsule Take 40 mg by mouth daily.     Social History   Socioeconomic  History  . Marital status: Married    Spouse name: Not on file  . Number of children: Not on file  . Years of education: Not on file  . Highest education level: Not on file  Occupational History  . Not on file  Social Needs  . Financial resource strain: Not on file  . Food insecurity:    Worry: Not on file    Inability: Not on file  . Transportation needs:    Medical: Not on file    Non-medical: Not on file  Tobacco Use  . Smoking status: Former Research scientist (life sciences)  . Smokeless tobacco: Never Used  Substance and Sexual Activity  . Alcohol use: No  . Drug use: No  . Sexual activity: Not on file  Lifestyle  . Physical activity:    Days per week: Not on file    Minutes per session: Not on file  . Stress: Not on file  Relationships  . Social connections:    Talks on phone: Not on file    Gets together: Not on file    Attends religious service: Not on file    Active member of club or organization: Not on file    Attends meetings of clubs or organizations: Not on file    Relationship status: Not on file  . Intimate partner violence:    Fear of current or ex partner: Not on file    Emotionally abused: Not on file    Physically abused: Not on file  Forced sexual activity: Not on file  Other Topics Concern  . Not on file  Social History Narrative  . Not on file   Family History  Problem Relation Age of Onset  . Hypertension Father   . Diabetes Father   . Heart disease Father   . Cancer Sister      OBJECTIVE:  Vitals:   09/12/18 0828 09/12/18 0830  Pulse:  98  Resp:  16  Temp:  98.1 F (36.7 C)  TempSrc:  Oral  SpO2:  100%  Weight: 138 lb (62.6 kg)     General appearance: Alert; appears uncomfortable, but nontoxic HEENT: NCAT.  Oropharynx clear. Dry mucous membranes Lungs: clear to auscultation bilaterally without adventitious breath sounds Heart: regular rate and rhythm.  Radial pulses 2+ symmetrical bilaterally Abdomen: soft, non-distended; normal active bowel  sounds; mild diffuse tenderness to palpation of the abdomen; no guarding Back: no CVA tenderness Extremities: no edema; symmetrical with no gross deformities Skin: warm and dry Neurologic: normal gait Psychological: alert and cooperative; normal mood and affect   ASSESSMENT & PLAN:  1. Nausea vomiting and diarrhea   2. Gastroenteritis     Meds ordered this encounter  Medications  . ondansetron (ZOFRAN-ODT) disintegrating tablet 4 mg  . ondansetron (ZOFRAN) 4 MG tablet    Sig: Take 1 tablet (4 mg total) by mouth every 6 (six) hours.    Dispense:  12 tablet    Refill:  0    Order Specific Question:   Supervising Provider    Answer:   Raylene Everts [[id number]]    Zofran given in office Get rest and drink fluids Zofran prescribed.  Take as directed.    DIET Instructions:  30 minutes after taking nausea medicine, begin with sips of clear liquids. If able to hold down 2 - 4 ounces for 30 minutes, begin drinking more. Increase your fluid intake to replace losses. Clear liquids only for 24 hours (water, tea, sport drinks, clear flat ginger ale or cola and juices, broth, jello, popsicles, ect). Advance to bland foods, applesauce, rice, baked or boiled chicken, ect. Avoid milk, greasy foods and anything that doesn't agree with you.  If you experience new or worsening symptoms return or go to ER such as fever, chills, nausea, vomiting, diarrhea, bloody or dark tarry stools, constipation, urinary symptoms, worsening abdominal discomfort, symptoms that do not improve with medications, inability to keep fluids down, etc...  Reviewed expectations re: course of current medical issues. Questions answered. Outlined signs and symptoms indicating need for more acute intervention. Patient verbalized understanding. After Visit Summary given.   Lestine Box, PA-C 09/12/18 [phone]

## 2018-09-12 NOTE — Discharge Instructions (Signed)
Zofran given in office Get rest and drink fluids Zofran prescribed.  Take as directed.    DIET Instructions:  30 minutes after taking nausea medicine, begin with sips of clear liquids. If able to hold down 2 - 4 ounces for 30 minutes, begin drinking more. Increase your fluid intake to replace losses. Clear liquids only for 24 hours (water, tea, sport drinks, clear flat ginger ale or cola and juices, broth, jello, popsicles, ect). Advance to bland foods, applesauce, rice, baked or boiled chicken, ect. Avoid milk, greasy foods and anything that doesn't agree with you.  If you experience new or worsening symptoms return or go to ER such as fever, chills, nausea, vomiting, diarrhea, bloody or dark tarry stools, constipation, urinary symptoms, worsening abdominal discomfort, symptoms that do not improve with medications, inability to keep fluids down, etc... 

## 2018-09-12 NOTE — ED Triage Notes (Signed)
Pt cc states she has diarrhea and nausea.  She says her pain on her left side near her shoulder blades. This started yesterday. Pt thinks she may have food poison from some bad cheese she had at home.

## 2018-10-12 ENCOUNTER — Ambulatory Visit (INDEPENDENT_AMBULATORY_CARE_PROVIDER_SITE_OTHER): Payer: No Typology Code available for payment source | Admitting: Family Medicine

## 2018-10-12 ENCOUNTER — Encounter (INDEPENDENT_AMBULATORY_CARE_PROVIDER_SITE_OTHER): Payer: Self-pay | Admitting: Family Medicine

## 2018-10-12 DIAGNOSIS — M25562 Pain in left knee: Secondary | ICD-10-CM | POA: Diagnosis not present

## 2018-10-12 DIAGNOSIS — G8929 Other chronic pain: Secondary | ICD-10-CM | POA: Diagnosis not present

## 2018-10-12 DIAGNOSIS — M25561 Pain in right knee: Secondary | ICD-10-CM

## 2018-10-12 MED ORDER — ETODOLAC 400 MG PO TABS: 400.0000 mg | ORAL_TABLET | Freq: Two times a day (BID) | ORAL | 3 refills | Status: DC | PRN

## 2018-10-12 NOTE — Patient Instructions (Signed)
   Glucosamine sulfate:  1,000 mg twice daily  Turmeric:  500 mg twice daily   
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
## 2018-10-18 ENCOUNTER — Ambulatory Visit: Payer: No Typology Code available for payment source | Attending: Family Medicine | Admitting: Physical Therapy

## 2018-10-18 ENCOUNTER — Encounter: Payer: Self-pay | Admitting: Physical Therapy

## 2018-10-18 ENCOUNTER — Other Ambulatory Visit: Payer: Self-pay

## 2018-10-18 DIAGNOSIS — M25661 Stiffness of right knee, not elsewhere classified: Secondary | ICD-10-CM | POA: Diagnosis present

## 2018-10-18 DIAGNOSIS — R600 Localized edema: Secondary | ICD-10-CM | POA: Insufficient documentation

## 2018-10-18 DIAGNOSIS — M25562 Pain in left knee: Secondary | ICD-10-CM | POA: Diagnosis present

## 2018-10-18 DIAGNOSIS — M25561 Pain in right knee: Secondary | ICD-10-CM | POA: Insufficient documentation

## 2018-10-18 DIAGNOSIS — R262 Difficulty in walking, not elsewhere classified: Secondary | ICD-10-CM | POA: Diagnosis present
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
## 2018-10-19 ENCOUNTER — Ambulatory Visit: Payer: No Typology Code available for payment source | Admitting: Physical Therapy

## 2018-10-19 DIAGNOSIS — M25562 Pain in left knee: Secondary | ICD-10-CM

## 2018-10-19 DIAGNOSIS — M25561 Pain in right knee: Secondary | ICD-10-CM | POA: Diagnosis not present

## 2018-10-19 DIAGNOSIS — M25661 Stiffness of right knee, not elsewhere classified: Secondary | ICD-10-CM

## 2018-10-19 DIAGNOSIS — R262 Difficulty in walking, not elsewhere classified: Secondary | ICD-10-CM

## 2018-10-19 MED FILL — ETODOLAC 400 MG TABS: 400 | 30 days supply | Qty: 60 | Fill #0

## 2018-10-19 NOTE — Therapy (Signed)
[place], [place], [address] Phone: [phone]   Fax:  [phone]  Physical Therapy Treatment  Patient Details  Name: Rachel Hawkins MRN: [id number] Date of Birth: 08-05-1961 Referring Provider (PT): Hilts   Encounter Date: 10/19/2018  PT End of Session - 10/19/18 1730    Visit Number  2    Date for PT Re-Evaluation  12/17/18    PT Start Time  1703    PT Stop Time  9892    PT Time Calculation (min)  52 min       Past Medical History:  Diagnosis Date  . Gestational diabetes   . History of colon polyps   . Hypertension   . IgA nephropathy determined by renal biopsy   . IgA nephropathy determined by renal biopsy   . IgA nephropathy with benign hematuria   . IgA nephropathy with benign hematuria     Past Surgical History:  Procedure Laterality Date  . CESAREAN SECTION    . CESAREAN SECTION      There were no vitals filed for this visit.  Subjective Assessment - 10/19/18 1705    Subjective  "patch might of helped some, moved puffiness over and still hurts"    Currently in Pain?  Yes    Pain Score  7     Pain Location  Knee    Pain Orientation  Right;Left                       [facility] Adult PT Treatment/Exercise - 10/19/18 0001      Exercises   Exercises  Knee/Hip      Knee/Hip Exercises: Aerobic   Nustep  L 4 5 min      Knee/Hip Exercises: Standing   Heel Raises  Both;15 reps   on airex   Knee Flexion  Strengthening;Both;20 reps   on airex     Knee/Hip Exercises: Seated   Long Arc Quad  Strengthening;Both;2 sets;10 reps   red tband   Ball Squeeze  20    Clamshell with TheraBand  Red    Hamstring Curl  Strengthening;Both;2 sets;10 reps   red tband   Sit to [employer]  10 reps      Modalities   Modalities  Electrical Stimulation;Iontophoresis;Moist Heat      Moist Heat Therapy   Number Minutes Moist Heat  15 Minutes    Moist Heat Location  Knee    MH at pt request     Electrical Stimulation   Electrical Stimulation Location  BIL knee    Electrical Stimulation Action  premod    Electrical Stimulation Parameters  supine    Electrical Stimulation Goals  Pain;Edema      Iontophoresis   Type of Iontophoresis  Dexamethasone    Location  right patellar area    Dose  74mA    Time  4 hour patch               PT Short Term Goals - 10/18/18 1139      PT SHORT TERM GOAL #1   Title  issued  HEP    Time  2    Period  Weeks    Status  New        PT Long Term Goals - 10/18/18 1139      PT LONG TERM GOAL #1   Title  decrease pain 50%    Time  8  Period  Weeks    Status  New      PT LONG TERM GOAL #2   Title  increase AROM to WFL's    Time  8    Period  Weeks    Status  New      PT LONG TERM GOAL #3   Title  increase strength to 4+/5    Time  8    Period  Weeks    Status  New      PT LONG TERM GOAL #4   Title  walk without limp    Time  8    Period  Weeks    Status  New      PT LONG TERM GOAL #5   Title  go up and down stairs step over step    Time  8    Period  Weeks    Status  New            Plan - 10/19/18 1730    Clinical Impression Statement  pt amb with antalgic gait without AD , no heel strike ,decreased wt shift and minimal knee flexion in swing phase. Difficulty with flex ex ie HS curl and marching. Pt slow with ex d/t pain.    PT Treatment/Interventions  ADLs/Self Care Home Management;Cryotherapy;Electrical Stimulation;Iontophoresis 4mg /ml Dexamethasone;Ultrasound;Therapeutic exercise;Therapeutic activities;Functional mobility training;Stair training;Neuromuscular re-education;Patient/family education;Vasopneumatic Device;Manual techniques    PT Next Visit Plan  assses response to tx and progress as tolerated       Patient will benefit from skilled therapeutic intervention in order to improve the following deficits and impairments:  Abnormal gait, Pain, Decreased mobility, Decreased  activity tolerance, Decreased range of motion, Impaired flexibility, Increased edema, Difficulty walking  Visit Diagnosis: Acute pain of right knee  Acute pain of left knee  Difficulty in walking, not elsewhere classified  Stiffness of right knee, not elsewhere classified     Problem List Patient Active Problem List   Diagnosis Date Noted  . Bronchitis 12/05/2015  . IgA nephropathy determined by renal biopsy   . History of colon polyps   . IgA nephropathy with benign hematuria   . Benign essential hypertension 08/05/2014  . Generalized osteoarthritis of multiple sites 08/05/2014  . GERD without esophagitis 08/05/2014    ,Rachel Hawkins 10/19/2018, 5:35 PM  Rachel Hawkins [place], [place], [address] Phone: [phone]   Fax:  [phone]  Name: Rachel Hawkins MRN: [id number] Date of Birth: Sep 01, 1961

## 2018-10-26 ENCOUNTER — Ambulatory Visit: Payer: No Typology Code available for payment source | Admitting: Physical Therapy

## 2018-10-26 DIAGNOSIS — M25661 Stiffness of right knee, not elsewhere classified: Secondary | ICD-10-CM

## 2018-10-26 DIAGNOSIS — M25562 Pain in left knee: Secondary | ICD-10-CM

## 2018-10-26 DIAGNOSIS — R262 Difficulty in walking, not elsewhere classified: Secondary | ICD-10-CM

## 2018-10-26 DIAGNOSIS — M25561 Pain in right knee: Secondary | ICD-10-CM | POA: Diagnosis not present

## 2018-10-26 NOTE — Therapy (Signed)
[place], [place], [address] Phone: [phone]   Fax:  [phone]  Physical Therapy Treatment  Patient Details  Name: Rachel Hawkins MRN: [id number] Date of Birth: 29-Dec-1960 Referring Provider (PT): Hilts   Encounter Date: 10/26/2018  PT End of Session - 10/26/18 1735    Visit Number  3    Date for PT Re-Evaluation  12/17/18    PT Start Time  2094    PT Stop Time  7096    PT Time Calculation (min)  53 min       Past Medical History:  Diagnosis Date  . Gestational diabetes   . History of colon polyps   . Hypertension   . IgA nephropathy determined by renal biopsy   . IgA nephropathy determined by renal biopsy   . IgA nephropathy with benign hematuria   . IgA nephropathy with benign hematuria     Past Surgical History:  Procedure Laterality Date  . CESAREAN SECTION    . CESAREAN SECTION      There were no vitals filed for this visit.  Subjective Assessment - 10/26/18 1706    Subjective  tx helping,walking better    Currently in Pain?  Yes    Pain Score  7     Pain Location  Knee                       [facility] Adult PT Treatment/Exercise - 10/26/18 0001      Knee/Hip Exercises: Aerobic   Nustep  L 4 7 min      Knee/Hip Exercises: Machines for Strengthening   Cybex Knee Extension  5# 3 sets 5   cued for full TKE , very slow    Cybex Knee Flexion  10# 2 sets 10    Cybex Leg Press  20# 2 sets 10   1st set PTA help     Knee/Hip Exercises: Standing   Heel Raises  15 reps;Both   black bar   Forward Step Up  Both;5 reps;Hand Hold: 2;Step Height: 6";2 sets   cued for full TKE with quad activation     Moist Heat Therapy   Number Minutes Moist Heat  15 Minutes    Moist Heat Location  Knee      Electrical Stimulation   Electrical Stimulation Location  BIL knee    Electrical Stimulation Action  premod    Electrical Stimulation Parameters  supine    Electrical Stimulation Goals  Pain;Edema      Iontophoresis   Type of Iontophoresis  Dexamethasone    Location  right patellar area    Dose  24mA    Time  4 hour patch               PT Short Term Goals - 10/26/18 1736      PT SHORT TERM GOAL #1   Title  issued  HEP    Baseline  pt verb at gym discussed using bike or nustep vs rower which she verb using    Status  Achieved        PT Long Term Goals - 10/18/18 1139      PT LONG TERM GOAL #1   Title  decrease pain 50%    Time  8    Period  Weeks    Status  New      PT LONG TERM GOAL #2   Title  increase AROM to WFL's    Time  8    Period  Weeks    Status  New      PT LONG TERM GOAL #3   Title  increase strength to 4+/5    Time  8    Period  Weeks    Status  New      PT LONG TERM GOAL #4   Title  walk without limp    Time  8    Period  Weeks    Status  New      PT LONG TERM GOAL #5   Title  go up and down stairs step over step    Time  8    Period  Weeks    Status  New            Plan - 10/26/18 1736    Clinical Impression Statement  pt with noted improved gait, increased cadeance with increased knee flex but not normal. circumduction noted at times in RT hip esp if stepping up. pt slow with wt machine ex today but tolerated well with cuing. pt responding well to modalities.    PT Next Visit Plan  assses response to tx and progress as tolerated       Patient will benefit from skilled therapeutic intervention in order to improve the following deficits and impairments:  Abnormal gait, Pain, Decreased mobility, Decreased activity tolerance, Decreased range of motion, Impaired flexibility, Increased edema, Difficulty walking  Visit Diagnosis: Acute pain of right knee  Acute pain of left knee  Difficulty in walking, not elsewhere classified  Stiffness of right knee, not elsewhere classified     Problem List Patient Active Problem List   Diagnosis Date Noted  . Bronchitis 12/05/2015  .  IgA nephropathy determined by renal biopsy   . History of colon polyps   . IgA nephropathy with benign hematuria   . Benign essential hypertension 08/05/2014  . Generalized osteoarthritis of multiple sites 08/05/2014  . GERD without esophagitis 08/05/2014    Lillan Mccreadie,[name] PTA 10/26/2018, 5:38 PM  [place], [place], [address] Phone: [phone]   Fax:  [phone]  Name: Rachel Hawkins MRN: [id number] Date of Birth: 07/11/1961

## 2018-10-27 ENCOUNTER — Ambulatory Visit: Payer: No Typology Code available for payment source | Admitting: Physical Therapy

## 2018-10-27 DIAGNOSIS — M25561 Pain in right knee: Secondary | ICD-10-CM | POA: Diagnosis not present

## 2018-10-27 DIAGNOSIS — R262 Difficulty in walking, not elsewhere classified: Secondary | ICD-10-CM

## 2018-10-27 DIAGNOSIS — M25562 Pain in left knee: Secondary | ICD-10-CM

## 2018-10-27 DIAGNOSIS — M25661 Stiffness of right knee, not elsewhere classified: Secondary | ICD-10-CM

## 2018-10-27 NOTE — Therapy (Signed)
[place], [place], [address] Phone: [phone]   Fax:  [phone]  Physical Therapy Treatment  Patient Details  Name: Rachel Hawkins MRN: [id number] Date of Birth: 05/11/1961 Referring Provider (PT): Hilts   Encounter Date: 10/27/2018  PT End of Session - 10/27/18 1739    Visit Number  4    Date for PT Re-Evaluation  12/17/18    PT Start Time  0626    PT Stop Time  1740    PT Time Calculation (min)  35 min       Past Medical History:  Diagnosis Date  . Gestational diabetes   . History of colon polyps   . Hypertension   . IgA nephropathy determined by renal biopsy   . IgA nephropathy determined by renal biopsy   . IgA nephropathy with benign hematuria   . IgA nephropathy with benign hematuria     Past Surgical History:  Procedure Laterality Date  . CESAREAN SECTION    . CESAREAN SECTION      There were no vitals filed for this visit.  Subjective Assessment - 10/27/18 1706    Subjective  walking is better but the bend is not. patch and electrodes kinds hurt last time    Currently in Pain?  Yes    Pain Score  6     Pain Location  Knee    Pain Orientation  Right;Left         [facility] PT Assessment - 10/27/18 0001      AROM   AROM Assessment Site  Knee    Right/Left Knee  Right;Left    Right Knee Extension  5    Right Knee Flexion  103    Left Knee Extension  0    Left Knee Flexion  105                   [facility] Adult PT Treatment/Exercise - 10/27/18 0001      Knee/Hip Exercises: Aerobic   Nustep  L 3 6 min LE only      Knee/Hip Exercises: Machines for Strengthening   Cybex Leg Press  20# 3 sets 10      Knee/Hip Exercises: Seated   Long Arc Quad  Strengthening;Both;2 sets;10 reps   red tband   Ball Squeeze  20    Clamshell with TheraBand  Red    Hamstring Curl  Strengthening;Both;2 sets;10 reps   red tband   Sit to [employer]  10 reps   very painful      Manual Therapy   Manual Therapy  Joint mobilization;Taping    Manual therapy comments  excellent increase in ROM    Joint Mobilization  belt mobs to increase flexion on RT, joint capsule stretch and pat. mobs BIL    Kinesiotex  Create Space   patella unloading              PT Short Term Goals - 10/26/18 1736      PT SHORT TERM GOAL #1   Title  issued  HEP    Baseline  pt verb at gym discussed using bike or nustep vs rower which she verb using    Status  Achieved        PT Long Term Goals - 10/18/18 1139      PT LONG TERM GOAL #1   Title  decrease pain 50%    Time  8  Period  Weeks    Status  New      PT LONG TERM GOAL #2   Title  increase AROM to WFL's    Time  8    Period  Weeks    Status  New      PT LONG TERM GOAL #3   Title  increase strength to 4+/5    Time  8    Period  Weeks    Status  New      PT LONG TERM GOAL #4   Title  walk without limp    Time  8    Period  Weeks    Status  New      PT LONG TERM GOAL #5   Title  go up and down stairs step over step    Time  8    Period  Weeks    Status  New            Plan - 10/27/18 1739    Clinical Impression Statement  pt with much improved ROM and gait but very pain limited with ex today and felt estim and patch made worse so did not do modalities today. discussed continued ROM ex at home and riding bike at gym, limited tolerance to MT but did tolerate some jt capsule stretching and patella mobs. trial of pat unloading taping as she was tender and swollen over pat tendon    PT Next Visit Plan  advised pt to try 1-2 more sessions and if not changes refer back to MD       Patient will benefit from skilled therapeutic intervention in order to improve the following deficits and impairments:  Abnormal gait, Pain, Decreased mobility, Decreased activity tolerance, Decreased range of motion, Impaired flexibility, Increased edema, Difficulty walking  Visit Diagnosis: Acute pain of right  knee  Acute pain of left knee  Difficulty in walking, not elsewhere classified  Stiffness of right knee, not elsewhere classified     Problem List Patient Active Problem List   Diagnosis Date Noted  . Bronchitis 12/05/2015  . IgA nephropathy determined by renal biopsy   . History of colon polyps   . IgA nephropathy with benign hematuria   . Benign essential hypertension 08/05/2014  . Generalized osteoarthritis of multiple sites 08/05/2014  . GERD without esophagitis 08/05/2014    PAYSEUR,[name] PTA 10/27/2018, 5:43 PM  [place], [place], [address] Phone: [phone]   Fax:  [phone]  Name: ALESHIA CARTELLI MRN: [id number] Date of Birth: 1961/08/24

## 2018-11-02 ENCOUNTER — Ambulatory Visit: Payer: No Typology Code available for payment source | Admitting: Physical Therapy

## 2018-11-07 ENCOUNTER — Ambulatory Visit: Payer: No Typology Code available for payment source | Attending: Family Medicine | Admitting: Physical Therapy

## 2018-11-07 DIAGNOSIS — R600 Localized edema: Secondary | ICD-10-CM | POA: Diagnosis present

## 2018-11-07 DIAGNOSIS — R262 Difficulty in walking, not elsewhere classified: Secondary | ICD-10-CM | POA: Diagnosis present

## 2018-11-07 DIAGNOSIS — M25562 Pain in left knee: Secondary | ICD-10-CM | POA: Insufficient documentation

## 2018-11-07 DIAGNOSIS — M25661 Stiffness of right knee, not elsewhere classified: Secondary | ICD-10-CM | POA: Diagnosis present

## 2018-11-07 DIAGNOSIS — M25561 Pain in right knee: Secondary | ICD-10-CM | POA: Insufficient documentation

## 2018-11-07 NOTE — Therapy (Signed)
[place], [place], [address] Phone: [phone]   Fax:  [phone]  Physical Therapy Treatment  Patient Details  Name: Rachel Hawkins MRN: [id number] Date of Birth: 05-May-1961 Referring Provider (PT): Hilts   Encounter Date: 11/07/2018  PT End of Session - 11/07/18 1207    Visit Number  5    Date for PT Re-Evaluation  12/17/18    PT Start Time  1132    PT Stop Time  1210    PT Time Calculation (min)  38 min       Past Medical History:  Diagnosis Date  . Gestational diabetes   . History of colon polyps   . Hypertension   . IgA nephropathy determined by renal biopsy   . IgA nephropathy determined by renal biopsy   . IgA nephropathy with benign hematuria   . IgA nephropathy with benign hematuria     Past Surgical History:  Procedure Laterality Date  . CESAREAN SECTION    . CESAREAN SECTION      There were no vitals filed for this visit.  Subjective Assessment - 11/07/18 1134    Subjective  50% better. taping made worse. pain is variable with activity-cant do anything for very long    Currently in Pain?  Yes    Pain Score  5     Pain Location  Knee         [facility] PT Assessment - 11/07/18 0001      AROM   Right Knee Extension  2    Right Knee Flexion  113    Left Knee Extension  0    Left Knee Flexion  115      Strength   Overall Strength Comments  4/5 BIL knee no pain                   [facility] Adult PT Treatment/Exercise - 11/07/18 0001      Knee/Hip Exercises: Aerobic   Recumbent Bike  5 min    Nustep  L 3 6 min LE only      Knee/Hip Exercises: Machines for Strengthening   Cybex Knee Extension  5# 2 sets 10    Cybex Knee Flexion  20# 2 sets 10    Cybex Leg Press  20# 3 sets 10      Knee/Hip Exercises: Standing   Heel Raises  15 reps;Both   toe raises   Lateral Step Up  Both;15 reps;Hand Hold: 2;Step Height: 6"    Forward Step Up  Both;15  reps;Hand Hold: 2;Step Height: 6"               PT Short Term Goals - 10/26/18 1736      PT SHORT TERM GOAL #1   Title  issued  HEP    Baseline  pt verb at gym discussed using bike or nustep vs rower which she verb using    Status  Achieved        PT Long Term Goals - 11/07/18 1153      PT LONG TERM GOAL #1   Title  decrease pain 50%    Status  Achieved      PT LONG TERM GOAL #2   Title  increase AROM to WFL's    Status  Partially Met      PT LONG TERM GOAL #3   Title  increase strength to 4+/5    Baseline  4/5    Status  Partially Met      PT LONG TERM GOAL #4   Title  walk without limp    Status  Partially Met      PT LONG TERM GOAL #5   Title  go up and down stairs step over step    Baseline  hesistant and painful at times    Status  Partially Met            Plan - 11/07/18 1154    Clinical Impression Statement  progressing with goals. Good ROM and strength improving. pt states pain 50% better and varies with activity. pt slow and purposeful with mvmts. overall increased ROM and gait.Marland Kitchen pt complains more of RT than left knee.    PT Treatment/Interventions  ADLs/Self Care Home Management;Cryotherapy;Electrical Stimulation;Iontophoresis 54m/ml Dexamethasone;Ultrasound;Therapeutic exercise;Therapeutic activities;Functional mobility training;Stair training;Neuromuscular re-education;Patient/family education;Vasopneumatic Device;Manual techniques    PT Next Visit Plan  progress strength and gait       Patient will benefit from skilled therapeutic intervention in order to improve the following deficits and impairments:     Visit Diagnosis: Acute pain of right knee  Acute pain of left knee  Difficulty in walking, not elsewhere classified  Stiffness of right knee, not elsewhere classified     Problem List Patient Active Problem List   Diagnosis Date Noted  . Bronchitis 12/05/2015  . IgA nephropathy determined by renal biopsy   . History of  colon polyps   . IgA nephropathy with benign hematuria   . Benign essential hypertension 08/05/2014  . Generalized osteoarthritis of multiple sites 08/05/2014  . GERD without esophagitis 08/05/2014    PAYSEUR,[name] PTA 11/07/2018, 12:13 PM  [place] [address]Phone: [phone]  Fax:  [phone] Name: Rachel LAHMMRN: [id number] of Birth: 312-Feb-1962

## 2018-11-09 ENCOUNTER — Ambulatory Visit: Payer: No Typology Code available for payment source | Admitting: Physical Therapy

## 2018-11-10 ENCOUNTER — Ambulatory Visit: Payer: No Typology Code available for payment source | Admitting: Physical Therapy

## 2018-11-15 ENCOUNTER — Ambulatory Visit: Payer: No Typology Code available for payment source | Admitting: Physical Therapy

## 2018-11-15 DIAGNOSIS — R262 Difficulty in walking, not elsewhere classified: Secondary | ICD-10-CM

## 2018-11-15 DIAGNOSIS — M25661 Stiffness of right knee, not elsewhere classified: Secondary | ICD-10-CM

## 2018-11-15 DIAGNOSIS — M25562 Pain in left knee: Secondary | ICD-10-CM

## 2018-11-15 DIAGNOSIS — M25561 Pain in right knee: Secondary | ICD-10-CM

## 2018-11-16 ENCOUNTER — Ambulatory Visit: Payer: No Typology Code available for payment source | Admitting: Physical Therapy

## 2018-11-16 NOTE — Therapy (Signed)
[place], [place], [address] Phone: [phone]   Fax:  [phone]  Physical Therapy Treatment  Patient Details  Name: Rachel Hawkins MRN: [id number] Date of Birth: 12/29/1960 Referring Provider (PT): Hilts   Encounter Date: 11/15/2018  PT End of Session - 11/15/18 1230    Visit Number  6    Date for PT Re-Evaluation  12/17/18    PT Start Time  1147    PT Stop Time  1245    PT Time Calculation (min)  58 min       Past Medical History:  Diagnosis Date  . Gestational diabetes   . History of colon polyps   . Hypertension   . IgA nephropathy determined by renal biopsy   . IgA nephropathy determined by renal biopsy   . IgA nephropathy with benign hematuria   . IgA nephropathy with benign hematuria     Past Surgical History:  Procedure Laterality Date  . CESAREAN SECTION    . CESAREAN SECTION      There were no vitals filed for this visit.  Subjective Assessment - 11/15/18 1151    Subjective  pain varies, maybe sometime times better. walking better. "maybe try electrodes again today"    Currently in Pain?  Yes    Pain Score  5     Pain Location  Knee    Pain Orientation  Right;Left                       [facility] Adult PT Treatment/Exercise - 11/15/18 0001      Knee/Hip Exercises: Aerobic   Elliptical  64fd/2 back I 5 R 4   very slow and increased pain BW on RT   Nustep  L 5 6 min LE only      Knee/Hip Exercises: Machines for Strengthening   Cybex Knee Extension  10# 2 sets 10   cued for full TKE   Cybex Knee Flexion  25# 2 sets 10    Cybex Leg Press  30# 2 sets 10   calf raises 30# 2 sets 10     Moist Heat Therapy   Number Minutes Moist Heat  15 Minutes    Moist Heat Location  Knee   heat at pt request     Electrical Stimulation   Electrical Stimulation Location  BIL knee    Electrical Stimulation Action  premod    Electrical Stimulation Parameters   supine    Electrical Stimulation Goals  Edema;Pain      Iontophoresis   Type of Iontophoresis  Dexamethasone    Location  right patellar area- more lateral    Dose  871m   Time  4 hour patch               PT Short Term Goals - 10/26/18 1736      PT SHORT TERM GOAL #1   Title  issued  HEP    Baseline  pt verb at gym discussed using bike or nustep vs rower which she verb using    Status  Achieved        PT Long Term Goals - 11/07/18 1153      PT LONG TERM GOAL #1   Title  decrease pain 50%    Status  Achieved      PT LONG TERM GOAL #2   Title  [facility]  Status  Partially Met      PT LONG TERM GOAL #3   Title  increase strength to 4+/5    Baseline  4/5    Status  Partially Met      PT LONG TERM GOAL #4   Title  walk without limp    Status  Partially Met      PT LONG TERM GOAL #5   Title  go up and down stairs step over step    Baseline  hesistant and painful at times    Status  Partially Met            Plan - 11/15/18 1230    Clinical Impression Statement  pt tolerated increased wt and reps today. c/o pain with elliptical. pt with noteable swelling under BIL patella causing pain with mvmt and tracking. pt requested trying modalites again thht she initailly stated she did not like.    PT Treatment/Interventions  ADLs/Self Care Home Management;Cryotherapy;Electrical Stimulation;Iontophoresis 49m/ml Dexamethasone;Ultrasound;Therapeutic exercise;Therapeutic activities;Functional mobility training;Stair training;Neuromuscular re-education;Patient/family education;Vasopneumatic Device;Manual techniques    PT Next Visit Plan  assess use of modalities again, check goals and progress as tolerated or refer back to MD       Patient will benefit from skilled therapeutic intervention in order to improve the following deficits and impairments:  Abnormal gait, Pain, Decreased mobility, Decreased activity tolerance, Decreased range of motion, Impaired  flexibility, Increased edema, Difficulty walking  Visit Diagnosis: Acute pain of right knee  Acute pain of left knee  Difficulty in walking, not elsewhere classified  Stiffness of right knee, not elsewhere classified     Problem List Patient Active Problem List   Diagnosis Date Noted  . Bronchitis 12/05/2015  . IgA nephropathy determined by renal biopsy   . History of colon polyps   . IgA nephropathy with benign hematuria   . Benign essential hypertension 08/05/2014  . Generalized osteoarthritis of multiple sites 08/05/2014  . GERD without esophagitis 08/05/2014    [name],[name] PTA 11/16/2018, 7:53 AM  [place] [address]Phone: [phone]  Fax:  [phone] Name: Rachel SOMESMRN: [id number] of Birth: 3February 01, 1962

## 2018-11-22 ENCOUNTER — Encounter: Payer: Self-pay | Admitting: Physical Therapy

## 2018-11-22 ENCOUNTER — Ambulatory Visit: Payer: No Typology Code available for payment source | Admitting: Physical Therapy

## 2018-11-22 DIAGNOSIS — M25561 Pain in right knee: Secondary | ICD-10-CM

## 2018-11-22 DIAGNOSIS — M25661 Stiffness of right knee, not elsewhere classified: Secondary | ICD-10-CM

## 2018-11-22 DIAGNOSIS — R600 Localized edema: Secondary | ICD-10-CM

## 2018-11-22 DIAGNOSIS — R262 Difficulty in walking, not elsewhere classified: Secondary | ICD-10-CM

## 2018-11-22 DIAGNOSIS — M25562 Pain in left knee: Secondary | ICD-10-CM

## 2018-11-22 NOTE — Therapy (Signed)
[place], [place], [address] Phone: [phone]   Fax:  [phone]  Physical Therapy Treatment  Patient Details  Name: Rachel Hawkins MRN: [id number] Date of Birth: Sep 04, 1961 Referring Provider (PT): Hilts   Encounter Date: 11/22/2018  PT End of Session - 11/22/18 1742    Visit Number  7    Date for PT Re-Evaluation  12/17/18    PT Start Time  1700    PT Stop Time  1755    PT Time Calculation (min)  55 min    Activity Tolerance  Patient limited by pain    Behavior During Therapy  [facility] for tasks assessed/performed       Past Medical History:  Diagnosis Date  . Gestational diabetes   . History of colon polyps   . Hypertension   . IgA nephropathy determined by renal biopsy   . IgA nephropathy determined by renal biopsy   . IgA nephropathy with benign hematuria   . IgA nephropathy with benign hematuria     Past Surgical History:  Procedure Laterality Date  . CESAREAN SECTION    . CESAREAN SECTION      There were no vitals filed for this visit.  Subjective Assessment - 11/22/18 1706    Subjective  Reports numbness in the knee, pain with walking    Currently in Pain?  Yes    Pain Score  4     Pain Location  Knee    Pain Orientation  Right;Left    Pain Descriptors / Indicators  Aching    Aggravating Factors   walking hurts    Pain Relieving Factors  "I think it is a little better overall"                       [facility] Adult PT Treatment/Exercise - 11/22/18 0001      Knee/Hip Exercises: Aerobic   Nustep  L 5 6 min LE only, cues needed to hold knees apart as she tended to go into valgus      Knee/Hip Exercises: Standing   Walking with Sports Cord  all directions      Knee/Hip Exercises: Supine   Short Arc Quad Sets  Both;2 sets;10 reps    Short Arc Quad Sets Limitations  verbal and tactile cues to get TKE and VMO    Other Supine Knee/Hip Exercises  feet on  ball K2C, small bridges      Moist Heat Therapy   Number Minutes Moist Heat  10 Minutes    Moist Heat Location  Knee      Electrical Stimulation   Electrical Stimulation Location  bilateral knees    Electrical Stimulation Action  premod    Electrical Stimulation Parameters  supine    Electrical Stimulation Goals  Pain               PT Short Term Goals - 10/26/18 1736      PT SHORT TERM GOAL #1   Title  issued  HEP    Baseline  pt verb at gym discussed using bike or nustep vs rower which she verb using    Status  Achieved        PT Long Term Goals - 11/22/18 1756      PT LONG TERM GOAL #1   Title  decrease pain 50%    Status  Achieved      PT LONG TERM  GOAL #2   Title  increase AROM to WFL's    Status  Partially Met      PT LONG TERM GOAL #3   Title  increase strength to 4+/5    Status  Partially Met      PT LONG TERM GOAL #4   Title  walk without limp    Status  Partially Met      PT LONG TERM GOAL #5   Title  go up and down stairs step over step    Status  Partially Met            Plan - 11/22/18 1743    Clinical Impression Statement  Patient continues to be focused on her pain levels and swelling of the knee area, she also seems to be anxious of the popping that she feels and hears.  I have explained what I feel this is, she does report overall that she is bending the knees better and having some less pain.    PT Next Visit Plan  continue to asses we talked about her returning to MD but she felt like she is starting to feel a little better    Consulted and Agree with Plan of Care  Patient       Patient will benefit from skilled therapeutic intervention in order to improve the following deficits and impairments:  Abnormal gait, Pain, Decreased mobility, Decreased activity tolerance, Decreased range of motion, Impaired flexibility, Increased edema, Difficulty walking  Visit Diagnosis: Acute pain of right knee  Acute pain of left  knee  Difficulty in walking, not elsewhere classified  Stiffness of right knee, not elsewhere classified  Localized edema     Problem List Patient Active Problem List   Diagnosis Date Noted  . Bronchitis 12/05/2015  . IgA nephropathy determined by renal biopsy   . History of colon polyps   . IgA nephropathy with benign hematuria   . Benign essential hypertension 08/05/2014  . Generalized osteoarthritis of multiple sites 08/05/2014  . GERD without esophagitis 08/05/2014    Sumner Boast., PT 11/22/2018, 5:57 PM  [place], [place], [address] Phone: [phone]   Fax:  [phone]  Name: Rachel Hawkins MRN: [id number] Date of Birth: 03/06/1961

## 2018-11-24 ENCOUNTER — Ambulatory Visit: Payer: No Typology Code available for payment source | Admitting: Physical Therapy

## 2018-11-24 ENCOUNTER — Encounter: Payer: No Typology Code available for payment source | Admitting: Physical Therapy

## 2018-11-28 ENCOUNTER — Encounter: Payer: No Typology Code available for payment source | Admitting: Physical Therapy

## 2018-11-28 ENCOUNTER — Ambulatory Visit: Payer: No Typology Code available for payment source | Admitting: Physical Therapy

## 2018-11-28 DIAGNOSIS — M25562 Pain in left knee: Secondary | ICD-10-CM

## 2018-11-28 DIAGNOSIS — M25561 Pain in right knee: Secondary | ICD-10-CM | POA: Diagnosis not present

## 2018-11-28 DIAGNOSIS — R262 Difficulty in walking, not elsewhere classified: Secondary | ICD-10-CM

## 2018-11-28 NOTE — Therapy (Signed)
[place], [place], [address] Phone: [phone]   Fax:  [phone]  Physical Therapy Treatment  Patient Details  Name: KYMORA SCIARA MRN: [id number] Date of Birth: 1961-09-12 Referring Provider (PT): Hilts   Encounter Date: 11/28/2018  PT End of Session - 11/28/18 1202    Visit Number  8    Date for PT Re-Evaluation  12/17/18    PT Start Time  9924    PT Stop Time  1230    PT Time Calculation (min)  45 min       Past Medical History:  Diagnosis Date  . Gestational diabetes   . History of colon polyps   . Hypertension   . IgA nephropathy determined by renal biopsy   . IgA nephropathy determined by renal biopsy   . IgA nephropathy with benign hematuria   . IgA nephropathy with benign hematuria     Past Surgical History:  Procedure Laterality Date  . CESAREAN SECTION    . CESAREAN SECTION      There were no vitals filed for this visit.  Subjective Assessment - 11/28/18 1149    Subjective  about the same, church steps are different and hurt. R > Left. taking [facility] and ibp for mouth/gum and head pain    Currently in Pain?  Yes    Pain Score  5     Pain Location  Knee    Pain Orientation  Right;Left                       [facility] Adult PT Treatment/Exercise - 11/28/18 0001      Knee/Hip Exercises: Aerobic   Nustep  L 5 6 min LE only, cues needed to hold knees apart as she tended to go into valgus      Knee/Hip Exercises: Machines for Strengthening   Cybex Knee Extension  10# 2 sets 10   cued to control mvmt and achieve full TKE   Cybex Knee Flexion  25# 2 sets 15      Knee/Hip Exercises: Standing   Other Standing Knee Exercises  3# hip flex,ext and abd, marching 10 times each leg      Knee/Hip Exercises: Supine   Quad Sets  Strengthening;Both;20 reps   green tband   Short Arc Quad Sets  Both;2 sets;10 reps    Short Arc Quad Sets Limitations  cued for full  TKE hold 3 sec 3 #    Straight Leg Raises  Strengthening;Both;10 reps   3# very difficult for pt    Straight Leg Raise with External Rotation  Strengthening;Both;10 reps   3#   Other Supine Knee/Hip Exercises  feet on ball K2C, small bridges   15 times each     Moist Heat Therapy   Number Minutes Moist Heat  15 Minutes    Moist Heat Location  Knee      Electrical Stimulation   Electrical Stimulation Location  bilateral knees    Electrical Stimulation Action  premod    Electrical Stimulation Parameters  supine    Electrical Stimulation Goals  Pain               PT Short Term Goals - 10/26/18 1736      PT SHORT TERM GOAL #1   Title  issued  HEP    Baseline  pt verb at gym discussed using bike or nustep vs rower which she verb  using    Status  Achieved        PT Long Term Goals - 11/22/18 1756      PT LONG TERM GOAL #1   Title  decrease pain 50%    Status  Achieved      PT LONG TERM GOAL #2   Title  increase AROM to WFL's    Status  Partially Met      PT LONG TERM GOAL #3   Title  increase strength to 4+/5    Status  Partially Met      PT LONG TERM GOAL #4   Title  walk without limp    Status  Partially Met      PT LONG TERM GOAL #5   Title  go up and down stairs step over step    Status  Partially Met            Plan - 11/28/18 1202    Clinical Impression Statement  pt continues to be pain focused even though she does verb some improvement. paina dnweakness with ex today and cued to control mvmt and speed.    PT Next Visit Plan  rec. calling MD for follow up as she verb more pain then should be normal at this stage. continue with strengthening. Assess goals ROM And MMT       Patient will benefit from skilled therapeutic intervention in order to improve the following deficits and impairments:  Abnormal gait, Pain, Decreased mobility, Decreased activity tolerance, Decreased range of motion, Impaired flexibility, Increased edema, Difficulty  walking  Visit Diagnosis: Acute pain of right knee  Acute pain of left knee  Difficulty in walking, not elsewhere classified     Problem List Patient Active Problem List   Diagnosis Date Noted  . Bronchitis 12/05/2015  . IgA nephropathy determined by renal biopsy   . History of colon polyps   . IgA nephropathy with benign hematuria   . Benign essential hypertension 08/05/2014  . Generalized osteoarthritis of multiple sites 08/05/2014  . GERD without esophagitis 08/05/2014    ,[name] PTA 11/28/2018, 12:09 PM  [place], [place], [address] Phone: [phone]   Fax:  [phone]  Name: ZUNAIRA LAMY MRN: [id number] Date of Birth: September 30, 1961

## 2018-11-30 ENCOUNTER — Encounter: Payer: No Typology Code available for payment source | Admitting: Physical Therapy

## 2018-11-30 ENCOUNTER — Ambulatory Visit: Payer: No Typology Code available for payment source | Admitting: Physical Therapy

## 2018-11-30 DIAGNOSIS — M25561 Pain in right knee: Secondary | ICD-10-CM

## 2018-11-30 DIAGNOSIS — M25562 Pain in left knee: Secondary | ICD-10-CM

## 2018-11-30 DIAGNOSIS — R262 Difficulty in walking, not elsewhere classified: Secondary | ICD-10-CM

## 2018-11-30 NOTE — Therapy (Signed)
[place], [place], [address] Phone: [phone]   Fax:  [phone]  Physical Therapy Treatment  Patient Details  Name: Rachel Hawkins MRN: [id number] Date of Birth: 1961-03-08 Referring Provider (PT): Hilts   Encounter Date: 11/30/2018  PT End of Session - 11/30/18 1209    Visit Number  9    Date for PT Re-Evaluation  12/17/18    PT Start Time  1140    PT Stop Time  1225    PT Time Calculation (min)  45 min       Past Medical History:  Diagnosis Date  . Gestational diabetes   . History of colon polyps   . Hypertension   . IgA nephropathy determined by renal biopsy   . IgA nephropathy determined by renal biopsy   . IgA nephropathy with benign hematuria   . IgA nephropathy with benign hematuria     Past Surgical History:  Procedure Laterality Date  . CESAREAN SECTION    . CESAREAN SECTION      There were no vitals filed for this visit.  Subjective Assessment - 11/30/18 1140    Subjective  still need to call MD for appt. walking is better also getting in and car is easier atleast 50% better. prolonged sitting/standing or certain mvmt still painful. electrodes and heat really help.    Currently in Pain?  Yes    Pain Score  4     Pain Location  Knee                       [facility] Adult PT Treatment/Exercise - 11/30/18 0001      Ambulation/Gait   Stairs  Yes    Stairs Assistance  7: Independent    Stair Management Technique  Alternating pattern    Number of Stairs  30    Gait Comments  amb around the building all terrains including curb neg 1000 + feet no AD   no deficiets noted but pt c/o favoring RT knee     Knee/Hip Exercises: Aerobic   Nustep  L 5 6 min LE only, cues needed to hold knees apart as she tended to go into valgus      Knee/Hip Exercises: Machines for Strengthening   Cybex Knee Extension  10# 2 sets 10    achieved full TKE with less cuing but  c/o RT knee pain   Cybex Knee Flexion  25# 2 sets 15    Cybex Leg Press  30# 2 sets 15   calf raises 30# 2 sets 15     Moist Heat Therapy   Number Minutes Moist Heat  15 Minutes    Moist Heat Location  Knee      Electrical Stimulation   Electrical Stimulation Location  bilateral knees    Electrical Stimulation Action  premod    Electrical Stimulation Parameters  supine    Electrical Stimulation Goals  Pain               PT Short Term Goals - 10/26/18 1736      PT SHORT TERM GOAL #1   Title  issued  HEP    Baseline  pt verb at gym discussed using bike or nustep vs rower which she verb using    Status  Achieved        PT Long Term Goals - 11/30/18 1142      PT LONG  TERM GOAL #1   Title  decrease pain 50%    Status  Achieved      PT LONG TERM GOAL #2   Title  increase AROM to WFL's    Status  Achieved      PT LONG TERM GOAL #3   Title  increase strength to 4+/5    Baseline  4/5    Status  Partially Met      PT LONG TERM GOAL #4   Title  walk without limp    Status  Partially Met      PT LONG TERM GOAL #5   Title  go up and down stairs step over step    Baseline  bertter but still RT knee is an issue    Status  Partially Met            Plan - 11/30/18 1211    Clinical Impression Statement  pain 50% better. ROM WFLs. MMT tests strong but fatigues with ext and pain limited. no gait/steps deficiets but c/o RT knee favoring.    PT Treatment/Interventions  ADLs/Self Care Home Management;Cryotherapy;Electrical Stimulation;Iontophoresis 4m/ml Dexamethasone;Ultrasound;Therapeutic exercise;Therapeutic activities;Functional mobility training;Stair training;Neuromuscular re-education;Patient/family education;Vasopneumatic Device;Manual techniques    PT Next Visit Plan  assess when sees MD and send note       Patient will benefit from skilled therapeutic intervention in order to improve the following deficits and impairments:  Abnormal gait, Pain, Decreased  mobility, Decreased activity tolerance, Decreased range of motion, Impaired flexibility, Increased edema, Difficulty walking  Visit Diagnosis: Acute pain of right knee  Acute pain of left knee  Difficulty in walking, not elsewhere classified     Problem List Patient Active Problem List   Diagnosis Date Noted  . Bronchitis 12/05/2015  . IgA nephropathy determined by renal biopsy   . History of colon polyps   . IgA nephropathy with benign hematuria   . Benign essential hypertension 08/05/2014  . Generalized osteoarthritis of multiple sites 08/05/2014  . GERD without esophagitis 08/05/2014    Allani Reber,[name] PTA 11/30/2018, 12:13 PM  [place] [address]Phone: [phone]  Fax:  [phone] Name: Rachel CHIHUAHUAMRN: [id number] of Birth: [birth year]

## 2018-12-01 MED FILL — CYCLOBENZAPRINE HCL 5 MG TA: 5 | 5 days supply | Qty: 5 | Fill #0

## 2018-12-05 ENCOUNTER — Ambulatory Visit: Payer: No Typology Code available for payment source | Attending: Family Medicine | Admitting: Physical Therapy

## 2018-12-05 DIAGNOSIS — R600 Localized edema: Secondary | ICD-10-CM | POA: Insufficient documentation

## 2018-12-05 DIAGNOSIS — M25562 Pain in left knee: Secondary | ICD-10-CM | POA: Diagnosis present

## 2018-12-05 DIAGNOSIS — M25661 Stiffness of right knee, not elsewhere classified: Secondary | ICD-10-CM | POA: Insufficient documentation

## 2018-12-05 DIAGNOSIS — R262 Difficulty in walking, not elsewhere classified: Secondary | ICD-10-CM | POA: Insufficient documentation

## 2018-12-05 DIAGNOSIS — M25561 Pain in right knee: Secondary | ICD-10-CM | POA: Insufficient documentation

## 2018-12-05 NOTE — Therapy (Signed)
[address] [place], [place], [address] Phone: [phone]   Fax:  [phone] Progress Note Reporting Period 10/18/18 to 12/05/18 for the first 10 visits See note below for Objective Data and Assessment of Progress/Goals.      Physical Therapy Treatment  Patient Details  Name: Rachel Hawkins MRN: [id number] Date of Birth: November 12, 1960 Referring Provider (PT): Hilts   Encounter Date: 12/05/2018  PT End of Session - 12/05/18 1742    Visit Number  10    Date for PT Re-Evaluation  12/17/18    PT Start Time  0092    PT Stop Time  3300    PT Time Calculation (min)  43 min       Past Medical History:  Diagnosis Date  . Gestational diabetes   . History of colon polyps   . Hypertension   . IgA nephropathy determined by renal biopsy   . IgA nephropathy determined by renal biopsy   . IgA nephropathy with benign hematuria   . IgA nephropathy with benign hematuria     Past Surgical History:  Procedure Laterality Date  . CESAREAN SECTION    . CESAREAN SECTION      There were no vitals filed for this visit.  Subjective Assessment - 12/05/18 1705    Subjective  MD Wednesday 2pm. Left knee not really bothering but the RT still is.    Currently in Pain?  Yes    Pain Score  4     Pain Location  Knee    Pain Orientation  Right         [facility] PT Assessment - 12/05/18 0001      AROM   AROM Assessment Site  Knee    Right/Left Knee  Right;Left    Right Knee Extension  0    Right Knee Flexion  120    Left Knee Extension  0    Left Knee Flexion  120      Strength   Overall Strength Comments  quad 4+/5 and HS 5/5 BIL                   [facility] Adult PT Treatment/Exercise - 12/05/18 0001      Knee/Hip Exercises: Aerobic   Elliptical  I 5 R 4 3 fwd/3 back    Recumbent Bike  L 1 5 min      Knee/Hip Exercises: Machines for Strengthening   Cybex Knee Extension  10# 2 sets 10   5# SL 10  each. RT leg more difficult   Cybex Knee Flexion  25# 2 sets 15    Cybex Leg Press  30# 2 sets 10, 20# SL 10 each      Knee/Hip Exercises: Standing   Lateral Step Up  Both;15 reps;Hand Hold: 2;Step Height: 8"    Forward Step Up  Both;15 reps;Hand Hold: 2;Step Height: 8"   c/o pain/heaviness on RT     Moist Heat Therapy   Number Minutes Moist Heat  7 Minutes   after 7 min estim and MH pt verb she needed to leave.    Moist Heat Location  Knee      Electrical Stimulation   Electrical Stimulation Location  bilateral knees    Electrical Stimulation Action  premod    Electrical Stimulation Parameters  supine    Electrical Stimulation Goals  Pain  PT Short Term Goals - 10/26/18 1736      PT SHORT TERM GOAL #1   Title  issued  HEP    Baseline  pt verb at gym discussed using bike or nustep vs rower which she verb using    Status  Achieved        PT Long Term Goals - 12/05/18 1706      PT LONG TERM GOAL #1   Title  decrease pain 50%    Status  Achieved      PT LONG TERM GOAL #2   Title  increase AROM to WFL's    Status  Achieved      PT LONG TERM GOAL #3   Title  increase strength to 4+/5    Status  Achieved      PT LONG TERM GOAL #4   Title  walk without limp    Status  Achieved      PT LONG TERM GOAL #5   Title  go up and down stairs step over step    Baseline  bertter but still RT knee is an issue    Status  Partially Met            Plan - 12/05/18 1707    Clinical Impression Statement  all goals met except steps are still an issue with RT knee. MMT and ROM WFLs. Pt still c/o RT knee pain and is limiting activities d/t pain . Pt c/o "lump" BIL lateral knees, esp with flexion. C/O heaviness with lifting RT leg.    PT Treatment/Interventions  ADLs/Self Care Home Management;Cryotherapy;Electrical Stimulation;Iontophoresis '4mg'$ /ml Dexamethasone;Ultrasound;Therapeutic exercise;Therapeutic activities;Functional mobility training;Stair  training;Neuromuscular re-education;Patient/family education;Vasopneumatic Device;Manual techniques    PT Next Visit Plan  MD note sent with pt for 12/07/2018 appt       Patient will benefit from skilled therapeutic intervention in order to improve the following deficits and impairments:  Abnormal gait, Pain, Decreased mobility, Decreased activity tolerance, Decreased range of motion, Impaired flexibility, Increased edema, Difficulty walking  Visit Diagnosis: Acute pain of right knee  Acute pain of left knee  Difficulty in walking, not elsewhere classified     Problem List Patient Active Problem List   Diagnosis Date Noted  . Bronchitis 12/05/2015  . IgA nephropathy determined by renal biopsy   . History of colon polyps   . IgA nephropathy with benign hematuria   . Benign essential hypertension 08/05/2014  . Generalized osteoarthritis of multiple sites 08/05/2014  . GERD without esophagitis 08/05/2014    Derral Colucci,[name] PTA 12/05/2018, 5:43 PM  [address] [place], [place], [address] Phone: [phone]   Fax:  [phone]  Name: GILLIAN MEEUWSEN MRN: [id number] Date of Birth: [birth year]

## 2018-12-07 ENCOUNTER — Encounter (INDEPENDENT_AMBULATORY_CARE_PROVIDER_SITE_OTHER): Payer: Self-pay | Admitting: Family Medicine

## 2018-12-07 ENCOUNTER — Ambulatory Visit: Payer: No Typology Code available for payment source | Admitting: Physical Therapy

## 2018-12-07 ENCOUNTER — Encounter: Payer: Self-pay | Admitting: Physical Therapy

## 2018-12-07 ENCOUNTER — Ambulatory Visit (INDEPENDENT_AMBULATORY_CARE_PROVIDER_SITE_OTHER): Payer: No Typology Code available for payment source | Admitting: Family Medicine

## 2018-12-07 DIAGNOSIS — M25562 Pain in left knee: Secondary | ICD-10-CM

## 2018-12-07 DIAGNOSIS — R262 Difficulty in walking, not elsewhere classified: Secondary | ICD-10-CM

## 2018-12-07 DIAGNOSIS — M25561 Pain in right knee: Secondary | ICD-10-CM

## 2018-12-07 DIAGNOSIS — M25661 Stiffness of right knee, not elsewhere classified: Secondary | ICD-10-CM

## 2018-12-07 DIAGNOSIS — R600 Localized edema: Secondary | ICD-10-CM

## 2018-12-07 NOTE — Therapy (Signed)
[address] [place], [place], [address] Phone: [phone]   Fax:  [phone]  Physical Therapy Treatment  Patient Details  Name: Rachel Hawkins MRN: [id number] Date of Birth: 30-Apr-1961 Referring Provider (PT): Hilts   Encounter Date: 12/07/2018  PT End of Session - 12/07/18 1744    Visit Number  11    Date for PT Re-Evaluation  12/17/18    PT Start Time  1702    PT Stop Time  1747    PT Time Calculation (min)  45 min    Activity Tolerance  Patient limited by pain    Behavior During Therapy  [facility] for tasks assessed/performed       Past Medical History:  Diagnosis Date  . Gestational diabetes   . History of colon polyps   . Hypertension   . IgA nephropathy determined by renal biopsy   . IgA nephropathy determined by renal biopsy   . IgA nephropathy with benign hematuria   . IgA nephropathy with benign hematuria     Past Surgical History:  Procedure Laterality Date  . CESAREAN SECTION    . CESAREAN SECTION      There were no vitals filed for this visit.  Subjective Assessment - 12/07/18 1712    Subjective  I saw MD, he is going to order an MRI    Currently in Pain?  Yes    Pain Score  4     Pain Location  Knee    Pain Orientation  Right    Aggravating Factors   bending, walking                       [facility] Adult PT Treatment/Exercise - 12/07/18 0001      Knee/Hip Exercises: Aerobic   Elliptical  I 5 R 4 3 fwd/3 back    Recumbent Bike  L 1 5 min      Knee/Hip Exercises: Machines for Strengthening   Cybex Knee Extension  10# 2 sets 15    Cybex Knee Flexion  25# 2 sets 15    Cybex Leg Press  30# 2 sets 10, 20# SL 10 each    Other Machine  5# hip extension and abduction, really needed a lot of help to do this due to compensation      Knee/Hip Exercises: Standing   Walking with Sports Cord  all directions      Moist Heat Therapy   Number Minutes Moist Heat  10  Minutes    Moist Heat Location  Knee      Electrical Stimulation   Electrical Stimulation Location  bilateral knees    Electrical Stimulation Action  Premod    Electrical Stimulation Parameters  supine    Electrical Stimulation Goals  Pain               PT Short Term Goals - 10/26/18 1736      PT SHORT TERM GOAL #1   Title  issued  HEP    Baseline  pt verb at gym discussed using bike or nustep vs rower which she verb using    Status  Achieved        PT Long Term Goals - 12/05/18 1706      PT LONG TERM GOAL #1   Title  decrease pain 50%    Status  Achieved      PT LONG TERM GOAL #2   Title  increase AROM to WFL's    Status  Achieved      PT LONG TERM GOAL #3   Title  increase strength to 4+/5    Status  Achieved      PT LONG TERM GOAL #4   Title  walk without limp    Status  Achieved      PT LONG TERM GOAL #5   Title  go up and down stairs step over step    Baseline  bertter but still RT knee is an issue    Status  Partially Met            Plan - 12/07/18 1744    Clinical Impression Statement  Patient reports that the MD is going to order an MRI.  She is overall doing better but still having difficulty wwith walking and with pain    PT Next Visit Plan  will hold treatment until MRI    Consulted and Agree with Plan of Care  Patient       Patient will benefit from skilled therapeutic intervention in order to improve the following deficits and impairments:  Abnormal gait, Pain, Decreased mobility, Decreased activity tolerance, Decreased range of motion, Impaired flexibility, Increased edema, Difficulty walking  Visit Diagnosis: Acute pain of right knee  Acute pain of left knee  Difficulty in walking, not elsewhere classified  Stiffness of right knee, not elsewhere classified  Localized edema     Problem List Patient Active Problem List   Diagnosis Date Noted  . Bronchitis 12/05/2015  . IgA nephropathy determined by renal biopsy   .  History of colon polyps   . IgA nephropathy with benign hematuria   . Benign essential hypertension 08/05/2014  . Generalized osteoarthritis of multiple sites 08/05/2014  . GERD without esophagitis 08/05/2014    ALBRIGHT,MICHAEL W., PT 12/07/2018, 5:51 PM  [facility] [address] [place], , [address] Phone: [phone]   Fax:  [phone]  Name: Rachel Hawkins MRN: [id number] Date of Birth: 05/16/1961   
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
## 2018-12-18 ENCOUNTER — Ambulatory Visit
Admission: RE | Admit: 2018-12-18 | Discharge: 2018-12-18 | Disposition: A | Discharge status: Home / Self Care | Payer: No Typology Code available for payment source | Source: Ambulatory Visit | Attending: Family Medicine | Admitting: Family Medicine

## 2018-12-18 ENCOUNTER — Other Ambulatory Visit: Payer: Self-pay

## 2018-12-18 DIAGNOSIS — M25562 Pain in left knee: Secondary | ICD-10-CM

## 2018-12-18 DIAGNOSIS — M25561 Pain in right knee: Secondary | ICD-10-CM

## 2018-12-19 ENCOUNTER — Telehealth (INDEPENDENT_AMBULATORY_CARE_PROVIDER_SITE_OTHER): Payer: Self-pay | Admitting: Family Medicine

## 2018-12-19 NOTE — Telephone Encounter (Signed)
The patient has requested a call from you on these results.

## 2018-12-19 NOTE — Telephone Encounter (Signed)
Will you call this patient?

## 2018-12-19 NOTE — Telephone Encounter (Signed)
Patient called wanting to know if Dr. Junius Roads would call her with the results of the MRI.  CB#[phone].  Thank you.
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
## 2018-12-19 NOTE — Telephone Encounter (Signed)
MRI scan of both knees showed arthritis behind the kneecaps, but no meniscus cartilage tears.  No indication for surgery at this point.  If pain becomes worse, could inject one or both knees with cortisone.  If that didn't provide long-term relief, then possibly gel injections.

## 2018-12-23 ENCOUNTER — Ambulatory Visit (INDEPENDENT_AMBULATORY_CARE_PROVIDER_SITE_OTHER): Payer: No Typology Code available for payment source | Admitting: Family Medicine

## 2018-12-23 ENCOUNTER — Other Ambulatory Visit: Payer: Self-pay

## 2018-12-23 ENCOUNTER — Encounter (INDEPENDENT_AMBULATORY_CARE_PROVIDER_SITE_OTHER): Payer: Self-pay | Admitting: Family Medicine

## 2018-12-23 DIAGNOSIS — G8929 Other chronic pain: Secondary | ICD-10-CM

## 2018-12-23 DIAGNOSIS — M25562 Pain in left knee: Secondary | ICD-10-CM

## 2018-12-23 DIAGNOSIS — M25561 Pain in right knee: Secondary | ICD-10-CM
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
## 2019-01-04 ENCOUNTER — Ambulatory Visit (INDEPENDENT_AMBULATORY_CARE_PROVIDER_SITE_OTHER): Payer: No Typology Code available for payment source | Admitting: Family Medicine

## 2019-01-26 ENCOUNTER — Telehealth (INDEPENDENT_AMBULATORY_CARE_PROVIDER_SITE_OTHER): Payer: Self-pay | Admitting: Family Medicine

## 2019-01-26 NOTE — Telephone Encounter (Signed)
Patient called to check on request for records to go to insurance co and herself. IC and advised mailed on 01/04/2019. Will mail another copy

## 2019-01-31 ENCOUNTER — Telehealth (INDEPENDENT_AMBULATORY_CARE_PROVIDER_SITE_OTHER): Payer: Self-pay | Admitting: Family Medicine

## 2019-01-31 NOTE — Telephone Encounter (Signed)
This is one of 2 messages from today on this. Trying to reach the patient - keep getting her voice mail, which is not set up yet. Will continue trying. See other message.

## 2019-01-31 NOTE — Telephone Encounter (Signed)
I spoke with the patient: she is continuing to have pain in both knees with numbness, mainly in the right knee. The knees feel "tight" and swollen. She would like to be seen for followup for this, but would also like to go ahead and have the gel injections in both knees as well. Please advise.

## 2019-01-31 NOTE — Telephone Encounter (Signed)
Please call patient in reference to having gel injection in both knees.

## 2019-01-31 NOTE — Telephone Encounter (Signed)
Patient request a call back to discuss the concerns she have regarding the looks of her knees. She states it was never discuss if she may have fluid in her knees.

## 2019-01-31 NOTE — Telephone Encounter (Signed)
Ok to get approval for gel injections.

## 2019-01-31 NOTE — Telephone Encounter (Signed)
Tried calling patient x 2. Her voice mail box has not been set up, so could not leave a message. Will try again later.

## 2019-02-01 ENCOUNTER — Telehealth (INDEPENDENT_AMBULATORY_CARE_PROVIDER_SITE_OTHER): Payer: Self-pay

## 2019-02-01 NOTE — Telephone Encounter (Signed)
Patient is aware she will be getting a call once the injections have been approved, and appointment will be made at that time.

## 2019-02-01 NOTE — Telephone Encounter (Signed)
Noted  

## 2019-02-01 NOTE — Telephone Encounter (Signed)
Submitted VOB for Monovisc, bilateral knee. 

## 2019-02-02 ENCOUNTER — Telehealth (INDEPENDENT_AMBULATORY_CARE_PROVIDER_SITE_OTHER): Payer: Self-pay | Admitting: Family Medicine

## 2019-02-02 NOTE — Telephone Encounter (Signed)
Patient request a call back with the status of the gel injections.  And when she can get an appointment scheduled.

## 2019-02-02 NOTE — Telephone Encounter (Signed)
pls advise patient. Thanks.

## 2019-02-03 ENCOUNTER — Telehealth (INDEPENDENT_AMBULATORY_CARE_PROVIDER_SITE_OTHER): Payer: Self-pay
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
## 2019-02-03 NOTE — Telephone Encounter (Signed)
Tried calling patient to discuss gel injection, but no answer and VM set-up to leave a message.  Will try again.

## 2019-02-06 ENCOUNTER — Telehealth: Payer: Self-pay | Admitting: Family Medicine

## 2019-02-06 NOTE — Telephone Encounter (Signed)
Case Mgr Rachael Darby from [employer] called in re to patient getting monovisc  [phone]

## 2019-02-07 ENCOUNTER — Telehealth: Payer: Self-pay

## 2019-02-07 NOTE — Telephone Encounter (Signed)
Talked with Rachael Darby at [employer] [facility] concerning gel injection.

## 2019-02-07 NOTE — Telephone Encounter (Signed)
Talked with patient and advised her of previous message concerning gel injection and cortisone injection.  Patient stated that she would try the cortisone injection first.  Appt.scheduled 02/14/2019 with Dr. Junius Roads

## 2019-02-07 NOTE — Telephone Encounter (Signed)
Received a call from case Manager Rachael Darby at [facility] stating that patient would need to try/fail cortisone injection before being approved for Monovisc injection, bilateral knee.  Case was canceled with Rachael Darby at [facility].  Called and left a VM for patient to call back to discuss gel injection.

## 2019-02-14 ENCOUNTER — Ambulatory Visit (INDEPENDENT_AMBULATORY_CARE_PROVIDER_SITE_OTHER): Payer: No Typology Code available for payment source | Admitting: Family Medicine

## 2019-02-14 ENCOUNTER — Encounter: Payer: Self-pay | Admitting: Family Medicine

## 2019-02-14 ENCOUNTER — Other Ambulatory Visit: Payer: Self-pay

## 2019-02-14 DIAGNOSIS — G8929 Other chronic pain: Secondary | ICD-10-CM | POA: Diagnosis not present

## 2019-02-14 DIAGNOSIS — M25561 Pain in right knee: Secondary | ICD-10-CM | POA: Diagnosis not present

## 2019-02-14 DIAGNOSIS — M25562 Pain in left knee: Secondary | ICD-10-CM
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
## 2019-05-10 ENCOUNTER — Encounter: Payer: Self-pay | Admitting: Internal Medicine

## 2019-05-10 MED FILL — SIMVASTATIN 40 MG TABLET: 40 | 90 days supply | Qty: 90 | Fill #0

## 2019-05-11 MED FILL — FREESTYLE LANCETS: 50 days supply | Qty: 100 | Fill #0

## 2019-05-11 MED FILL — FREESTYLE LITE METER: 30 days supply | Qty: 1 | Fill #0

## 2019-05-11 MED FILL — FREESTYLE LITE TEST STRIP: 50 days supply | Qty: 100 | Fill #0

## 2019-06-02 ENCOUNTER — Telehealth: Payer: Self-pay | Admitting: *Deleted

## 2019-06-02 NOTE — Telephone Encounter (Signed)
PT. Forgot about appointment,rescheduling for 06/05/19 @ 8am.

## 2019-06-05 ENCOUNTER — Ambulatory Visit (AMBULATORY_SURGERY_CENTER): Payer: Self-pay | Admitting: *Deleted

## 2019-06-05 ENCOUNTER — Other Ambulatory Visit: Payer: Self-pay

## 2019-06-05 VITALS — Temp 98.0°F | Ht 59.5 in | Wt 145.2 lb

## 2019-06-05 DIAGNOSIS — Z1211 Encounter for screening for malignant neoplasm of colon: Secondary | ICD-10-CM

## 2019-06-05 MED ORDER — NA SULFATE-K SULFATE-MG SULF 17.5-3.13-1.6 GM/177ML PO SOLN: 1.0000 | Freq: Once | ORAL | 0 refills | Status: AC

## 2019-06-05 MED FILL — SUPREP BOWEL PREP KIT: 17.5-3.13-1 | 1 days supply | Qty: 354 | Fill #0
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
## 2019-06-07 ENCOUNTER — Other Ambulatory Visit: Payer: Self-pay | Admitting: Internal Medicine

## 2019-06-15 ENCOUNTER — Encounter: Payer: No Typology Code available for payment source | Admitting: Internal Medicine

## 2019-06-20 ENCOUNTER — Encounter: Payer: Self-pay | Admitting: Dietician

## 2019-06-20 ENCOUNTER — Encounter: Payer: No Typology Code available for payment source | Attending: Family Medicine | Admitting: Dietician

## 2019-06-20 ENCOUNTER — Other Ambulatory Visit: Payer: Self-pay

## 2019-06-20 DIAGNOSIS — E119 Type 2 diabetes mellitus without complications: Secondary | ICD-10-CM | POA: Insufficient documentation

## 2019-06-20 NOTE — Progress Notes (Signed)
Diabetes Self-Management Education  Visit Type: First/Initial  Appt. Start Time: 7:30am Appt. End Time: 8:55am  06/21/2019  Rachel Hawkins, identified by name and date of birth, is a 58 y.o. female with a diagnosis of Diabetes: Type 2.   ASSESSMENT  Patient expressed concerns over her health due to her family hx of diabetes, cancer, and stroke and asked many questions regarding how she can prevent these issues. States she is not too concerned with her blood sugar levels since her A1c is "not too high."   Patient states she has been trying to "do better" with diet since finding out she has diabetes. This includes not eating as many carbohydrates and cutting out sweetened beverages. States she mostly eats meat and vegetables now, still likes to snack on chips. States she used to be a lot more physically active, but hasn't been walking as much since her dog passed away.    Diabetes Self-Management Education - 06/20/19 0741      Visit Information   Visit Type  First/Initial      Initial Visit   Diabetes Type  Type 2    Are you currently following a meal plan?  No    Are you taking your medications as prescribed?  No    Date Diagnosed  August 2020      Health Coping   How would you rate your overall health?  Good      Psychosocial Assessment   Patient Belief/Attitude about Diabetes  Motivated to manage diabetes    Self-care barriers  None    Self-management support  Doctor's office;Family    Special Needs  None    Preferred Learning Style  No preference indicated    Learning Readiness  Change in progress    How often do you need to have someone help you when you read instructions, pamphlets, or other written materials from your doctor or pharmacy?  1 - Never    What is the last grade level you completed in school?  [id number]      Complications   Last HgB A1C per patient/outside source  6.7 %   05/04/2019   How often do you check your blood sugar?  1-2 times/day    Fasting Blood  glucose range (mg/dL)  70-129;130-179    Postprandial Blood glucose range (mg/dL)  70-129;130-179    Have you had a dilated eye exam in the past 12 months?  Yes    Have you had a dental exam in the past 12 months?  No    Are you checking your feet?  Yes    How many days per week are you checking your feet?  5      Dietary Intake   Breakfast  coffee w/ sugar + creamer    Snack (morning)  crackers   or granola bar,  or egg McMuffin   Lunch  [employer] southwestern salad   or [employer] sandwich on wheat bread   Dinner  grilled chicken + brown rice + broccoli   or roast + potato salad   Beverage(s)  water, coffee      Exercise   Exercise Type  ADL's;Light (walking / raking leaves)    How many days per week to you exercise?  5    How many minutes per day do you exercise?  30    Total minutes per week of exercise  150      Patient Education   Previous Diabetes Education  No  Disease state   Definition of diabetes, type 1 and 2, and the diagnosis of diabetes;Factors that contribute to the development of diabetes;Explored patient's options for treatment of their diabetes    Nutrition management   Role of diet in the treatment of diabetes and the relationship between the three main macronutrients and blood glucose level;Food label reading, portion sizes and measuring food.;Carbohydrate counting;Reviewed blood glucose goals for pre and post meals and how to evaluate the patients' food intake on their blood glucose level.;Information on hints to eating out and maintain blood glucose control.    Physical activity and exercise   Role of exercise on diabetes management, blood pressure control and cardiac health.;Helped patient identify appropriate exercises in relation to his/her diabetes, diabetes complications and other health issue.    Monitoring  Purpose and frequency of SMBG.;Taught/evaluated SMBG meter.;Taught/discussed recording of test results and interpretation of SMBG.;Identified appropriate SMBG  and/or A1C goals.;Daily foot exams;Yearly dilated eye exam    Acute complications  Discussed and identified patients' treatment of hyperglycemia.;Taught treatment of hypoglycemia - the 15 rule.;Covered sick day management with medication and food.    Chronic complications  Relationship between chronic complications and blood glucose control;Assessed and discussed foot care and prevention of foot problems;Lipid levels, blood glucose control and heart disease;Retinopathy and reason for yearly dilated eye exams;Applicable immunizations    Psychosocial adjustment  Role of stress on diabetes;Identified and addressed patients feelings and concerns about diabetes      Individualized Goals (developed by patient)   Nutrition  General guidelines for healthy choices and portions discussed    Medications  Not Applicable    Monitoring   test blood glucose pre and post meals as discussed   Per MD, fasting and evening (2 hrs after dinner) daily     Outcomes   Expected Outcomes  Demonstrated interest in learning. Expect positive outcomes    Future DMSE  PRN    Program Status  Completed       Individualized Plan for Diabetes Self-Management Training:   Learning Objective:  Patient will have a greater understanding of diabetes self-management. Patient education plan is to attend individual and/or group sessions per assessed needs and concerns.  Expected Outcomes:  Demonstrated interest in learning. Expect positive outcomes  Education material provided: ADA - How to Thrive: A Guide for Your Journey with Diabetes, A1C conversion sheet, Meal plan card, My Plate, Snack sheet and Carbohydrate counting sheet  If problems or questions, patient to contact team via:  Phone and Email  Future DSME appointment: PRN

## 2019-06-26 MED FILL — SUPREP BOWEL PREP KIT: 17.5-3.13-1 | 1 days supply | Qty: 354 | Fill #0

## 2019-06-29 ENCOUNTER — Encounter: Payer: Self-pay | Admitting: Internal Medicine

## 2019-07-06 ENCOUNTER — Telehealth: Payer: Self-pay

## 2019-07-06 NOTE — Telephone Encounter (Signed)
Covid-19 screening questions   Do you now or have you had a fever in the last 14 days? NO   Do you have any respiratory symptoms of shortness of breath or cough now or in the last 14 days? NO  Do you have any family members or close contacts with diagnosed or suspected Covid-19 in the past 14 days? NO  Have you been tested for Covid-19 and found to be positive? NO        

## 2019-07-07 ENCOUNTER — Encounter: Payer: Self-pay | Admitting: Internal Medicine

## 2019-07-07 ENCOUNTER — Ambulatory Visit (AMBULATORY_SURGERY_CENTER): Payer: No Typology Code available for payment source | Admitting: Internal Medicine

## 2019-07-07 ENCOUNTER — Other Ambulatory Visit: Payer: Self-pay

## 2019-07-07 VITALS — BP 120/80 | HR 70 | Temp 99.0°F | Resp 15 | Ht 59.0 in | Wt 145.0 lb

## 2019-07-07 DIAGNOSIS — D123 Benign neoplasm of transverse colon: Secondary | ICD-10-CM

## 2019-07-07 DIAGNOSIS — Z1211 Encounter for screening for malignant neoplasm of colon: Secondary | ICD-10-CM

## 2019-07-07 DIAGNOSIS — Z800 Family history of malignant neoplasm of digestive organs: Secondary | ICD-10-CM | POA: Diagnosis present

## 2019-07-07 DIAGNOSIS — D120 Benign neoplasm of cecum: Secondary | ICD-10-CM

## 2019-07-07 MED ORDER — SODIUM CHLORIDE 0.9 % IV SOLN: 500.0000 mL | INTRAVENOUS | Status: DC

## 2019-07-07 NOTE — Patient Instructions (Signed)
YOU HAD AN ENDOSCOPIC PROCEDURE TODAY AT THE [facility]:   Refer to the procedure report that was given to you for any specific questions about what was found during the examination.  If the procedure report does not answer your questions, please call your gastroenterologist to clarify.  If you requested that your care partner not be given the details of your procedure findings, then the procedure report has been included in a sealed envelope for you to review at your convenience later.  YOU SHOULD EXPECT: Some feelings of bloating in the abdomen. Passage of more gas than usual.  Walking can help get rid of the air that was put into your GI tract during the procedure and reduce the bloating. If you had a lower endoscopy (such as a colonoscopy or flexible sigmoidoscopy) you may notice spotting of blood in your stool or on the toilet paper. If you underwent a bowel prep for your procedure, you may not have a normal bowel movement for a few days.  Please Note:  You might notice some irritation and congestion in your nose or some drainage.  This is from the oxygen used during your procedure.  There is no need for concern and it should clear up in a day or so.  SYMPTOMS TO REPORT IMMEDIATELY:   Following lower endoscopy (colonoscopy or flexible sigmoidoscopy):  Excessive amounts of blood in the stool  Significant tenderness or worsening of abdominal pains  Swelling of the abdomen that is new, acute  Fever of 100F or higher  For urgent or emergent issues, a gastroenterologist can be reached at any hour by calling [phone].   DIET:  We do recommend a small meal at first, but then you may proceed to your regular diet.  Drink plenty of fluids but you should avoid alcoholic beverages for 24 hours.  ACTIVITY:  You should plan to take it easy for the rest of today and you should NOT DRIVE or use heavy machinery until tomorrow (because of the sedation medicines used during the test).     FOLLOW UP: Our staff will call the number listed on your records 48-72 hours following your procedure to check on you and address any questions or concerns that you may have regarding the information given to you following your procedure. If we do not reach you, we will leave a message.  We will attempt to reach you two times.  During this call, we will ask if you have developed any symptoms of COVID 19. If you develop any symptoms (ie: fever, flu-like symptoms, shortness of breath, cough etc.) before then, please call [phone].  If you test positive for Covid 19 in the 2 weeks post procedure, please call and report this information to us.    If any biopsies were taken you will be contacted by phone or by letter within the next 1-3 weeks.  Please call us at [phone] if you have not heard about the biopsies in 3 weeks.    SIGNATURES/CONFIDENTIALITY: You and/or your care partner have signed paperwork which will be entered into your electronic medical record.  These signatures attest to the fact that that the information above on your After Visit Summary has been reviewed and is understood.  Full responsibility of the confidentiality of this discharge information lies with you and/or your care-partner. 

## 2019-07-07 NOTE — Progress Notes (Signed)
Report given to PACU, vss 

## 2019-07-07 NOTE — Op Note (Signed)
[facility] Patient Name: Rachel Hawkins Procedure Date: 07/07/2019 3:42 PM MRN: [id number] Endoscopist: Jerene Bears , MD Age: 58 Referring MD:  Date of Birth: [birth year] Gender: Female Account #: [id number] Procedure:                Colonoscopy Indications:              Screening in patient at increased risk: Family                            history of 1st-degree relative with colorectal                            cancer, Last colonoscopy: November 2013 Medicines:                Monitored Anesthesia Care Procedure:                Pre-Anesthesia Assessment:                           - Prior to the procedure, a History and Physical                            was performed, and patient medications and                            allergies were reviewed. The patient's tolerance of                            previous anesthesia was also reviewed. The risks                            and benefits of the procedure and the sedation                            options and risks were discussed with the patient.                            All questions were answered, and informed consent                            was obtained. Prior Anticoagulants: The patient has                            taken no previous anticoagulant or antiplatelet                            agents. ASA Grade Assessment: II - A patient with                            mild systemic disease. After reviewing the risks                            and benefits, the patient was deemed in  satisfactory condition to undergo the procedure.                           After obtaining informed consent, the colonoscope                            was passed under direct vision. Throughout the                            procedure, the patient's blood pressure, pulse, and                            oxygen saturations were monitored continuously. The                            Colonoscope was  introduced through the anus and                            advanced to the cecum, identified by appendiceal                            orifice and ileocecal valve. The colonoscopy was                            performed without difficulty. The patient tolerated                            the procedure well. The quality of the bowel                            preparation was good. The ileocecal valve,                            appendiceal orifice, and rectum were photographed. Scope In: 3:56:39 PM Scope Out: 4:11:42 PM Scope Withdrawal Time: 0 hours 12 minutes 26 seconds  Total Procedure Duration: 0 hours 15 minutes 3 seconds  Findings:                 The digital rectal exam was normal.                           A 2 mm polyp was found in the cecum. The polyp was                            sessile. The polyp was removed with a cold biopsy                            forceps. Resection and retrieval were complete.                           A 5 mm polyp was found in the proximal transverse                            colon. The polyp  was sessile. The polyp was removed                            with a cold snare. Resection and retrieval were                            complete.                           Multiple small-mouthed diverticula were found in                            the sigmoid colon, descending colon and hepatic                            flexure.                           Internal hemorrhoids were found during                            retroflexion. The hemorrhoids were small. Complications:            No immediate complications. Estimated Blood Loss:     Estimated blood loss was minimal. Impression:               - One 2 mm polyp in the cecum, removed with a cold                            biopsy forceps. Resected and retrieved.                           - One 5 mm polyp in the proximal transverse colon,                            removed with a cold snare. Resected and  retrieved.                           - Diverticulosis in the sigmoid colon, in the                            descending colon and at the hepatic flexure.                           - Small internal hemorrhoids. Recommendation:           - Patient has a contact number available for                            emergencies. The signs and symptoms of potential                            delayed complications were discussed with the                            patient. Return to normal activities  tomorrow.                            Written discharge instructions were provided to the                            patient.                           - Resume previous diet.                           - Continue present medications.                           - Await pathology results.                           - Repeat colonoscopy is recommended for                            surveillance (likely in 5 years). The colonoscopy                            date will be determined after pathology results                            from today's exam become available for review. Jerene Bears, MD 07/07/2019 4:17:50 PM This report has been signed electronically.

## 2019-07-07 NOTE — Progress Notes (Signed)
[facility] temps. Liliana Cline IV, Leahmarie Gasiorowski checked in pt.   Pt's states no medical or surgical changes since previsit

## 2019-07-07 NOTE — Progress Notes (Signed)
Called to room to assist during endoscopic procedure.  Patient ID and intended procedure confirmed with present staff. Received instructions for my participation in the procedure from the performing physician.  

## 2019-07-11 ENCOUNTER — Telehealth: Payer: Self-pay

## 2019-07-11 NOTE — Telephone Encounter (Signed)
  Follow up Call-  Call back number 07/07/2019  Post procedure Call Back phone  # [phone]  Permission to leave phone message Yes  Some recent data might be hidden     Patient questions:  Do you have a fever, pain , or abdominal swelling? No. Pain Score  0 *  Have you tolerated food without any problems? Yes.    Have you been able to return to your normal activities? Yes.    Do you have any questions about your discharge instructions: Diet   No. Medications  No. Follow up visit  No.  Do you have questions or concerns about your Care? No.  Actions: * If pain score is 4 or above: No action needed, pain <4.  1. Have you developed a fever since your procedure? no  2.   Have you had an respiratory symptoms (SOB or cough) since your procedure? no  3.   Have you tested positive for COVID 19 since your procedure no  4.   Have you had any family members/close contacts diagnosed with the COVID 19 since your procedure?  no   If yes to any of these questions please route to Joylene John, RN and Alphonsa Gin, Therapist, sports.

## 2019-07-13 ENCOUNTER — Encounter: Payer: Self-pay | Admitting: Internal Medicine
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
## 2019-12-18 MED FILL — SIMVASTATIN 40 MG TABS: 40 | 90 days supply | Qty: 90 | Fill #1

## 2020-01-12 MED FILL — LISINOPRIL 2.5 MG TABLET: 2.5 | 90 days supply | Qty: 90 | Fill #0

## 2020-03-25 MED FILL — SULFAMETHOXAZOLE-TMP DS TAB: 800-160 | 5 days supply | Qty: 10 | Fill #0

## 2020-03-28 MED FILL — SIMVASTATIN 40 MG TABS: 40 | 90 days supply | Qty: 90 | Fill #0

## 2020-04-05 MED FILL — LISINOPRIL 2.5 MG TABLET: 2.5 | 90 days supply | Qty: 90 | Fill #1

## 2020-04-05 MED FILL — SULFAMETHOXAZOLE-TMP DS TAB: 800-160 | 5 days supply | Qty: 10 | Fill #0

## 2020-05-31 ENCOUNTER — Other Ambulatory Visit (HOSPITAL_COMMUNITY)
Admission: RE | Admit: 2020-05-31 | Discharge: 2020-05-31 | Disposition: A | Discharge status: Home / Self Care | Payer: No Typology Code available for payment source | Source: Ambulatory Visit | Attending: Physician Assistant | Admitting: Physician Assistant

## 2020-05-31 ENCOUNTER — Other Ambulatory Visit: Payer: Self-pay | Admitting: Physician Assistant

## 2020-05-31 DIAGNOSIS — Z124 Encounter for screening for malignant neoplasm of cervix: Secondary | ICD-10-CM | POA: Diagnosis not present

## 2020-06-03 LAB — CYTOLOGY - PAP
Comment: NEGATIVE
Diagnosis: NEGATIVE
High risk HPV: POSITIVE — AB

## 2020-06-12 ENCOUNTER — Other Ambulatory Visit (HOSPITAL_BASED_OUTPATIENT_CLINIC_OR_DEPARTMENT_OTHER): Payer: Self-pay | Admitting: Physician Assistant

## 2020-06-12 MED FILL — METFORMIN HCL 500 MG TABS: 500 | 30 days supply | Qty: 30 | Fill #0

## 2020-07-02 ENCOUNTER — Other Ambulatory Visit (HOSPITAL_BASED_OUTPATIENT_CLINIC_OR_DEPARTMENT_OTHER): Payer: Self-pay | Admitting: Family Medicine

## 2020-07-02 MED FILL — SIMVASTATIN 40 MG TABS: 40 | 90 days supply | Qty: 90 | Fill #0

## 2020-07-02 MED FILL — LISINOPRIL 2.5 MG TABLET: 2.5 | 90 days supply | Qty: 90 | Fill #0

## 2020-07-16 MED FILL — METFORMIN HCL 500 MG TABS: 500 | 30 days supply | Qty: 30 | Fill #1

## 2020-08-05 ENCOUNTER — Other Ambulatory Visit (HOSPITAL_BASED_OUTPATIENT_CLINIC_OR_DEPARTMENT_OTHER): Payer: Self-pay | Admitting: Family Medicine

## 2020-08-06 MED FILL — VIRTUSSIN AC LIQUID: 100-10 | 5 days supply | Qty: 200 | Fill #0

## 2020-08-20 ENCOUNTER — Other Ambulatory Visit (HOSPITAL_BASED_OUTPATIENT_CLINIC_OR_DEPARTMENT_OTHER): Payer: Self-pay | Admitting: Physician Assistant

## 2020-08-20 MED FILL — FREESTYLE LITE TEST STRIP: 50 days supply | Qty: 100 | Fill #0

## 2020-08-20 MED FILL — FREESTYLE LANCETS: 50 days supply | Qty: 100 | Fill #0

## 2020-10-09 MED FILL — SIMVASTATIN 40 MG TABS: 40 | 90 days supply | Qty: 90 | Fill #1

## 2020-10-28 MED FILL — LISINOPRIL 2.5 MG TABLET: 2.5 | 90 days supply | Qty: 90 | Fill #1

## 2020-11-21 ENCOUNTER — Other Ambulatory Visit (HOSPITAL_BASED_OUTPATIENT_CLINIC_OR_DEPARTMENT_OTHER): Payer: Self-pay | Admitting: Physician Assistant

## 2020-11-21 MED FILL — FREESTYLE LITE TEST STRIP: 50 days supply | Qty: 100 | Fill #0

## 2020-11-22 MED FILL — FREESTYLE LANCETS: 50 days supply | Qty: 100 | Fill #0

## 2021-01-17 ENCOUNTER — Other Ambulatory Visit (HOSPITAL_BASED_OUTPATIENT_CLINIC_OR_DEPARTMENT_OTHER): Payer: Self-pay

## 2021-01-20 ENCOUNTER — Other Ambulatory Visit (HOSPITAL_BASED_OUTPATIENT_CLINIC_OR_DEPARTMENT_OTHER): Payer: Self-pay

## 2021-01-20 MED ORDER — SIMVASTATIN 40 MG PO TABS
ORAL_TABLET | ORAL | 0 refills | Status: DC
  Filled 2021-01-20: qty 30, 30d supply, fill #0

## 2021-01-28 ENCOUNTER — Other Ambulatory Visit (HOSPITAL_BASED_OUTPATIENT_CLINIC_OR_DEPARTMENT_OTHER): Payer: Self-pay

## 2021-01-28 MED ORDER — LISINOPRIL 2.5 MG PO TABS
ORAL_TABLET | ORAL | 1 refills | Status: DC
  Filled 2021-01-28 – 2021-02-10 (×2): qty 90, 90d supply, fill #0
  Filled 2021-05-21: qty 90, 90d supply, fill #1

## 2021-01-29 ENCOUNTER — Other Ambulatory Visit (HOSPITAL_BASED_OUTPATIENT_CLINIC_OR_DEPARTMENT_OTHER): Payer: Self-pay

## 2021-01-29 MED ORDER — SIMVASTATIN 40 MG PO TABS
ORAL_TABLET | ORAL | 1 refills | Status: DC
  Filled 2021-01-29 – 2021-04-01 (×2): qty 90, 90d supply, fill #0
  Filled 2021-07-14: qty 90, 90d supply, fill #1

## 2021-02-04 ENCOUNTER — Other Ambulatory Visit (HOSPITAL_BASED_OUTPATIENT_CLINIC_OR_DEPARTMENT_OTHER): Payer: Self-pay

## 2021-02-10 ENCOUNTER — Other Ambulatory Visit (HOSPITAL_BASED_OUTPATIENT_CLINIC_OR_DEPARTMENT_OTHER): Payer: Self-pay

## 2021-02-27 ENCOUNTER — Other Ambulatory Visit (HOSPITAL_BASED_OUTPATIENT_CLINIC_OR_DEPARTMENT_OTHER): Payer: Self-pay

## 2021-02-27 MED FILL — Glucose Blood Test Strip: 50 days supply | Qty: 100 | Fill #0 | Status: AC

## 2021-02-28 ENCOUNTER — Other Ambulatory Visit (HOSPITAL_BASED_OUTPATIENT_CLINIC_OR_DEPARTMENT_OTHER): Payer: Self-pay

## 2021-04-01 ENCOUNTER — Other Ambulatory Visit (HOSPITAL_BASED_OUTPATIENT_CLINIC_OR_DEPARTMENT_OTHER): Payer: Self-pay

## 2021-05-21 ENCOUNTER — Other Ambulatory Visit (HOSPITAL_BASED_OUTPATIENT_CLINIC_OR_DEPARTMENT_OTHER): Payer: Self-pay

## 2021-07-14 ENCOUNTER — Other Ambulatory Visit (HOSPITAL_BASED_OUTPATIENT_CLINIC_OR_DEPARTMENT_OTHER): Payer: Self-pay

## 2021-07-22 ENCOUNTER — Other Ambulatory Visit (HOSPITAL_BASED_OUTPATIENT_CLINIC_OR_DEPARTMENT_OTHER): Payer: Self-pay

## 2021-07-22 MED FILL — Lancets: 50 days supply | Qty: 100 | Fill #0 | Status: AC

## 2021-07-22 MED FILL — Glucose Blood Test Strip: 50 days supply | Qty: 100 | Fill #1 | Status: AC

## 2021-07-25 ENCOUNTER — Other Ambulatory Visit (HOSPITAL_BASED_OUTPATIENT_CLINIC_OR_DEPARTMENT_OTHER): Payer: Self-pay

## 2021-09-02 ENCOUNTER — Other Ambulatory Visit (HOSPITAL_BASED_OUTPATIENT_CLINIC_OR_DEPARTMENT_OTHER): Payer: Self-pay

## 2021-09-03 ENCOUNTER — Other Ambulatory Visit (HOSPITAL_BASED_OUTPATIENT_CLINIC_OR_DEPARTMENT_OTHER): Payer: Self-pay

## 2021-09-03 MED ORDER — LISINOPRIL 2.5 MG PO TABS
2.5000 mg | ORAL_TABLET | Freq: Every day | ORAL | 0 refills | Status: DC
  Filled 2021-09-03: qty 90, 90d supply, fill #0

## 2021-09-04 ENCOUNTER — Other Ambulatory Visit (HOSPITAL_BASED_OUTPATIENT_CLINIC_OR_DEPARTMENT_OTHER): Payer: Self-pay

## 2021-09-26 ENCOUNTER — Other Ambulatory Visit (HOSPITAL_BASED_OUTPATIENT_CLINIC_OR_DEPARTMENT_OTHER): Payer: Self-pay

## 2021-09-26 MED ORDER — SIMVASTATIN 40 MG PO TABS
ORAL_TABLET | ORAL | 1 refills | Status: DC
  Filled 2021-09-26: qty 90, 90d supply, fill #0
  Filled 2022-01-21: qty 90, 90d supply, fill #1

## 2021-09-26 MED ORDER — LISINOPRIL 2.5 MG PO TABS
ORAL_TABLET | ORAL | 1 refills | Status: DC
  Filled 2021-09-26 – 2021-12-10 (×2): qty 90, 90d supply, fill #0
  Filled 2022-03-13: qty 90, 90d supply, fill #1

## 2021-10-07 ENCOUNTER — Other Ambulatory Visit (HOSPITAL_BASED_OUTPATIENT_CLINIC_OR_DEPARTMENT_OTHER): Payer: Self-pay

## 2021-11-24 ENCOUNTER — Other Ambulatory Visit (HOSPITAL_COMMUNITY): Payer: Self-pay

## 2021-12-10 ENCOUNTER — Other Ambulatory Visit (HOSPITAL_BASED_OUTPATIENT_CLINIC_OR_DEPARTMENT_OTHER): Payer: Self-pay

## 2022-01-21 ENCOUNTER — Other Ambulatory Visit (HOSPITAL_BASED_OUTPATIENT_CLINIC_OR_DEPARTMENT_OTHER): Payer: Self-pay

## 2022-01-22 ENCOUNTER — Other Ambulatory Visit (HOSPITAL_BASED_OUTPATIENT_CLINIC_OR_DEPARTMENT_OTHER): Payer: Self-pay

## 2022-02-04 ENCOUNTER — Other Ambulatory Visit (HOSPITAL_BASED_OUTPATIENT_CLINIC_OR_DEPARTMENT_OTHER): Payer: Self-pay

## 2022-02-04 MED ORDER — AMOXICILLIN 875 MG PO TABS
875.0000 mg | ORAL_TABLET | Freq: Two times a day (BID) | ORAL | 0 refills | Status: DC
  Filled 2022-02-04: qty 14, 7d supply, fill #0

## 2022-03-04 ENCOUNTER — Other Ambulatory Visit (HOSPITAL_BASED_OUTPATIENT_CLINIC_OR_DEPARTMENT_OTHER): Payer: Self-pay

## 2022-03-05 ENCOUNTER — Other Ambulatory Visit (HOSPITAL_BASED_OUTPATIENT_CLINIC_OR_DEPARTMENT_OTHER): Payer: Self-pay

## 2022-03-05 MED ORDER — FREESTYLE LANCETS MISC
2 refills | Status: DC
  Filled 2022-03-05: qty 100, 50d supply, fill #0
  Filled 2022-09-30: qty 100, 50d supply, fill #1
  Filled 2023-01-06: qty 100, 50d supply, fill #2

## 2022-03-05 MED ORDER — FREESTYLE LITE TEST VI STRP
ORAL_STRIP | 2 refills | Status: DC
  Filled 2022-03-05: qty 100, 50d supply, fill #0
  Filled 2022-07-10: qty 100, 50d supply, fill #1
  Filled 2023-01-06: qty 100, 50d supply, fill #2

## 2022-03-06 ENCOUNTER — Other Ambulatory Visit (HOSPITAL_BASED_OUTPATIENT_CLINIC_OR_DEPARTMENT_OTHER): Payer: Self-pay

## 2022-03-11 ENCOUNTER — Other Ambulatory Visit (HOSPITAL_BASED_OUTPATIENT_CLINIC_OR_DEPARTMENT_OTHER): Payer: Self-pay

## 2022-03-13 ENCOUNTER — Other Ambulatory Visit (HOSPITAL_BASED_OUTPATIENT_CLINIC_OR_DEPARTMENT_OTHER): Payer: Self-pay

## 2022-04-01 ENCOUNTER — Other Ambulatory Visit (HOSPITAL_BASED_OUTPATIENT_CLINIC_OR_DEPARTMENT_OTHER): Payer: Self-pay

## 2022-04-01 MED ORDER — LISINOPRIL 2.5 MG PO TABS
ORAL_TABLET | ORAL | 1 refills | Status: DC
  Filled 2022-09-11 (×2): qty 90, 90d supply, fill #0

## 2022-04-01 MED ORDER — SIMVASTATIN 40 MG PO TABS
ORAL_TABLET | ORAL | 1 refills | Status: DC
  Filled 2022-04-30: qty 90, 90d supply, fill #0
  Filled 2022-10-29: qty 90, 90d supply, fill #1

## 2022-04-30 ENCOUNTER — Other Ambulatory Visit (HOSPITAL_BASED_OUTPATIENT_CLINIC_OR_DEPARTMENT_OTHER): Payer: Self-pay

## 2022-05-18 ENCOUNTER — Other Ambulatory Visit (HOSPITAL_BASED_OUTPATIENT_CLINIC_OR_DEPARTMENT_OTHER): Payer: Self-pay

## 2022-05-18 MED ORDER — NAPROXEN 500 MG PO TABS
ORAL_TABLET | ORAL | 0 refills | Status: DC
  Filled 2022-05-18: qty 10, 5d supply, fill #0

## 2022-07-10 ENCOUNTER — Other Ambulatory Visit (HOSPITAL_BASED_OUTPATIENT_CLINIC_OR_DEPARTMENT_OTHER): Payer: Self-pay

## 2022-07-30 ENCOUNTER — Other Ambulatory Visit (HOSPITAL_BASED_OUTPATIENT_CLINIC_OR_DEPARTMENT_OTHER): Payer: Self-pay

## 2022-07-30 MED ORDER — PANTOPRAZOLE SODIUM 40 MG PO TBEC
40.0000 mg | DELAYED_RELEASE_TABLET | Freq: Every day | ORAL | 0 refills | Status: DC
  Filled 2022-07-30: qty 30, 30d supply, fill #0

## 2022-09-11 ENCOUNTER — Other Ambulatory Visit (HOSPITAL_BASED_OUTPATIENT_CLINIC_OR_DEPARTMENT_OTHER): Payer: Self-pay

## 2022-09-11 ENCOUNTER — Other Ambulatory Visit: Payer: Self-pay

## 2022-10-02 ENCOUNTER — Other Ambulatory Visit (HOSPITAL_BASED_OUTPATIENT_CLINIC_OR_DEPARTMENT_OTHER): Payer: Self-pay

## 2022-11-28 DIAGNOSIS — J018 Other acute sinusitis: Secondary | ICD-10-CM | POA: Diagnosis not present

## 2023-01-06 ENCOUNTER — Other Ambulatory Visit (HOSPITAL_BASED_OUTPATIENT_CLINIC_OR_DEPARTMENT_OTHER): Payer: Self-pay

## 2023-01-09 DIAGNOSIS — H1089 Other conjunctivitis: Secondary | ICD-10-CM | POA: Diagnosis not present

## 2023-01-11 ENCOUNTER — Other Ambulatory Visit (HOSPITAL_BASED_OUTPATIENT_CLINIC_OR_DEPARTMENT_OTHER): Payer: Self-pay

## 2023-01-19 DIAGNOSIS — E1169 Type 2 diabetes mellitus with other specified complication: Secondary | ICD-10-CM | POA: Diagnosis not present

## 2023-01-19 DIAGNOSIS — I10 Essential (primary) hypertension: Secondary | ICD-10-CM | POA: Diagnosis not present

## 2023-01-19 DIAGNOSIS — Z0000 Encounter for general adult medical examination without abnormal findings: Secondary | ICD-10-CM | POA: Diagnosis not present

## 2023-01-19 DIAGNOSIS — E785 Hyperlipidemia, unspecified: Secondary | ICD-10-CM | POA: Diagnosis not present

## 2023-02-26 ENCOUNTER — Other Ambulatory Visit (HOSPITAL_BASED_OUTPATIENT_CLINIC_OR_DEPARTMENT_OTHER): Payer: Self-pay

## 2023-02-26 MED ORDER — FREESTYLE LANCETS MISC
2 refills | Status: DC
  Filled 2023-02-26: qty 100, 50d supply, fill #0
  Filled 2023-06-04: qty 100, 50d supply, fill #1
  Filled 2023-08-05: qty 100, 50d supply, fill #2

## 2023-03-02 ENCOUNTER — Other Ambulatory Visit (HOSPITAL_BASED_OUTPATIENT_CLINIC_OR_DEPARTMENT_OTHER): Payer: Self-pay

## 2023-03-03 ENCOUNTER — Other Ambulatory Visit (HOSPITAL_BASED_OUTPATIENT_CLINIC_OR_DEPARTMENT_OTHER): Payer: Self-pay

## 2023-03-03 MED ORDER — GLUCOSE BLOOD VI STRP
ORAL_STRIP | 0 refills | Status: DC
Start: 2023-03-03 — End: 2023-06-04
  Filled 2023-03-03: qty 100, 50d supply, fill #0

## 2023-03-05 ENCOUNTER — Other Ambulatory Visit (HOSPITAL_BASED_OUTPATIENT_CLINIC_OR_DEPARTMENT_OTHER): Payer: Self-pay

## 2023-03-24 ENCOUNTER — Other Ambulatory Visit (HOSPITAL_BASED_OUTPATIENT_CLINIC_OR_DEPARTMENT_OTHER): Payer: Self-pay

## 2023-03-24 DIAGNOSIS — E785 Hyperlipidemia, unspecified: Secondary | ICD-10-CM | POA: Diagnosis not present

## 2023-03-24 MED ORDER — SIMVASTATIN 40 MG PO TABS
40.0000 mg | ORAL_TABLET | Freq: Every evening | ORAL | 3 refills | Status: DC
  Filled 2023-03-24 – 2023-04-12 (×2): qty 90, 90d supply, fill #0

## 2023-04-02 ENCOUNTER — Other Ambulatory Visit (HOSPITAL_BASED_OUTPATIENT_CLINIC_OR_DEPARTMENT_OTHER): Payer: Self-pay

## 2023-04-09 ENCOUNTER — Other Ambulatory Visit (HOSPITAL_BASED_OUTPATIENT_CLINIC_OR_DEPARTMENT_OTHER): Payer: Self-pay

## 2023-04-12 ENCOUNTER — Other Ambulatory Visit (HOSPITAL_BASED_OUTPATIENT_CLINIC_OR_DEPARTMENT_OTHER): Payer: Self-pay

## 2023-06-04 ENCOUNTER — Other Ambulatory Visit (HOSPITAL_BASED_OUTPATIENT_CLINIC_OR_DEPARTMENT_OTHER): Payer: Self-pay

## 2023-06-04 MED ORDER — GLUCOSE BLOOD VI STRP
1.0000 | ORAL_STRIP | 1 refills | Status: DC
Start: 2023-06-04 — End: 2024-07-14
  Filled 2023-06-04: qty 100, 50d supply, fill #0
  Filled 2023-11-04: qty 50, 25d supply, fill #1

## 2023-06-08 ENCOUNTER — Other Ambulatory Visit (HOSPITAL_BASED_OUTPATIENT_CLINIC_OR_DEPARTMENT_OTHER): Payer: Self-pay

## 2023-06-08 MED ORDER — FREESTYLE LITE TEST VI STRP
1.0000 | ORAL_STRIP | Freq: Two times a day (BID) | 1 refills | Status: AC
  Filled 2023-06-08: qty 200, 90d supply, fill #0

## 2023-06-11 DIAGNOSIS — E119 Type 2 diabetes mellitus without complications: Secondary | ICD-10-CM | POA: Diagnosis not present

## 2023-07-05 ENCOUNTER — Other Ambulatory Visit (HOSPITAL_COMMUNITY): Payer: Self-pay

## 2023-08-06 DIAGNOSIS — K8020 Calculus of gallbladder without cholecystitis without obstruction: Secondary | ICD-10-CM | POA: Diagnosis not present

## 2023-08-06 DIAGNOSIS — R935 Abnormal findings on diagnostic imaging of other abdominal regions, including retroperitoneum: Secondary | ICD-10-CM | POA: Diagnosis not present

## 2023-08-06 DIAGNOSIS — R1013 Epigastric pain: Secondary | ICD-10-CM | POA: Diagnosis not present

## 2023-09-20 DIAGNOSIS — Z1231 Encounter for screening mammogram for malignant neoplasm of breast: Secondary | ICD-10-CM | POA: Diagnosis not present

## 2023-11-04 ENCOUNTER — Other Ambulatory Visit (HOSPITAL_BASED_OUTPATIENT_CLINIC_OR_DEPARTMENT_OTHER): Payer: Self-pay

## 2024-03-28 DIAGNOSIS — E1169 Type 2 diabetes mellitus with other specified complication: Secondary | ICD-10-CM | POA: Diagnosis not present

## 2024-03-28 DIAGNOSIS — I10 Essential (primary) hypertension: Secondary | ICD-10-CM | POA: Diagnosis not present

## 2024-03-28 DIAGNOSIS — Z0000 Encounter for general adult medical examination without abnormal findings: Secondary | ICD-10-CM | POA: Diagnosis not present

## 2024-03-28 DIAGNOSIS — E785 Hyperlipidemia, unspecified: Secondary | ICD-10-CM | POA: Diagnosis not present

## 2024-03-28 DIAGNOSIS — E1165 Type 2 diabetes mellitus with hyperglycemia: Secondary | ICD-10-CM | POA: Diagnosis not present

## 2024-04-03 DIAGNOSIS — R109 Unspecified abdominal pain: Secondary | ICD-10-CM | POA: Diagnosis not present

## 2024-04-06 ENCOUNTER — Other Ambulatory Visit (HOSPITAL_BASED_OUTPATIENT_CLINIC_OR_DEPARTMENT_OTHER): Payer: Self-pay

## 2024-04-06 MED ORDER — AMOXICILLIN 875 MG PO TABS
875.0000 mg | ORAL_TABLET | Freq: Two times a day (BID) | ORAL | 0 refills | Status: DC
  Filled 2024-04-06: qty 14, 7d supply, fill #0

## 2024-05-04 ENCOUNTER — Telehealth: Payer: Self-pay | Admitting: Internal Medicine

## 2024-05-04 NOTE — Telephone Encounter (Signed)
 Good Morning Dr Albertus   We received a call from patient requesting to transfer care due to her previous provider no longer accepting her insurance. Patient wanting to schedule her colonoscopy.   Records available for review in epic. Please review and advise on scheduling.   Thank you

## 2024-05-08 NOTE — Telephone Encounter (Signed)
 Okay for surveillance colonoscopy in the [facility] for history of adenomatous polyps This can be scheduled via previsit

## 2024-05-10 ENCOUNTER — Encounter: Payer: Self-pay | Admitting: Internal Medicine

## 2024-05-11 NOTE — Telephone Encounter (Signed)
 Patient scheduled for procedure and pre visit late October.

## 2024-06-08 ENCOUNTER — Other Ambulatory Visit (HOSPITAL_BASED_OUTPATIENT_CLINIC_OR_DEPARTMENT_OTHER): Payer: Self-pay

## 2024-06-09 ENCOUNTER — Other Ambulatory Visit (HOSPITAL_BASED_OUTPATIENT_CLINIC_OR_DEPARTMENT_OTHER): Payer: Self-pay

## 2024-06-09 MED ORDER — FREESTYLE LANCETS MISC
1 refills | Status: AC
  Filled 2024-06-09 (×3): qty 100, 50d supply, fill #0

## 2024-06-09 MED ORDER — GLUCOSE BLOOD VI STRP
ORAL_STRIP | 1 refills | Status: DC
  Filled 2024-06-09 (×3): qty 100, 50d supply, fill #0

## 2024-06-23 ENCOUNTER — Other Ambulatory Visit (HOSPITAL_BASED_OUTPATIENT_CLINIC_OR_DEPARTMENT_OTHER): Payer: Self-pay

## 2024-07-14 ENCOUNTER — Other Ambulatory Visit (HOSPITAL_BASED_OUTPATIENT_CLINIC_OR_DEPARTMENT_OTHER): Payer: Self-pay

## 2024-07-14 ENCOUNTER — Ambulatory Visit (AMBULATORY_SURGERY_CENTER)

## 2024-07-14 VITALS — Ht 59.0 in | Wt 131.0 lb

## 2024-07-14 DIAGNOSIS — Z800 Family history of malignant neoplasm of digestive organs: Secondary | ICD-10-CM

## 2024-07-14 DIAGNOSIS — Z860100 Personal history of colon polyps, unspecified: Secondary | ICD-10-CM

## 2024-07-14 MED ORDER — NA SULFATE-K SULFATE-MG SULF 17.5-3.13-1.6 GM/177ML PO SOLN
1.0000 | Freq: Once | ORAL | 0 refills | Status: AC
  Filled 2024-07-14: qty 354, 1d supply, fill #0

## 2024-07-14 NOTE — Progress Notes (Signed)

## 2024-07-17 ENCOUNTER — Other Ambulatory Visit (HOSPITAL_BASED_OUTPATIENT_CLINIC_OR_DEPARTMENT_OTHER): Payer: Self-pay

## 2024-07-24 ENCOUNTER — Encounter: Payer: Self-pay | Admitting: Internal Medicine

## 2024-07-27 ENCOUNTER — Telehealth: Payer: Self-pay | Admitting: Internal Medicine

## 2024-07-27 NOTE — Telephone Encounter (Signed)
 Patient called because she did have some popcorn several days ago and had some corn and peas that were in a roast she made last weekend. I encouraged her to drink extra water to help and flush her out better. She agreed and had no further questions.

## 2024-07-27 NOTE — Telephone Encounter (Signed)
 Inbound call from patient stating that she is scheduled to have a colonoscopy tomorrow and request to speak with a nurse. She states she was reading over her prep instructions and noticed she had eaten certain things she wasn't supposed to have 5 days before. Patient is requesting a call back to discuss. Please advise.

## 2024-07-28 ENCOUNTER — Encounter: Payer: Self-pay | Admitting: Internal Medicine

## 2024-07-28 ENCOUNTER — Ambulatory Visit (AMBULATORY_SURGERY_CENTER): Admitting: Internal Medicine

## 2024-07-28 VITALS — BP 113/72 | HR 70 | Temp 97.8°F | Resp 13 | Ht 59.0 in | Wt 131.0 lb

## 2024-07-28 DIAGNOSIS — Z860101 Personal history of adenomatous and serrated colon polyps: Secondary | ICD-10-CM | POA: Diagnosis not present

## 2024-07-28 DIAGNOSIS — K5730 Diverticulosis of large intestine without perforation or abscess without bleeding: Secondary | ICD-10-CM

## 2024-07-28 DIAGNOSIS — Z860100 Personal history of colon polyps, unspecified: Secondary | ICD-10-CM

## 2024-07-28 DIAGNOSIS — K648 Other hemorrhoids: Secondary | ICD-10-CM | POA: Diagnosis not present

## 2024-07-28 DIAGNOSIS — E785 Hyperlipidemia, unspecified: Secondary | ICD-10-CM | POA: Diagnosis not present

## 2024-07-28 DIAGNOSIS — Z1211 Encounter for screening for malignant neoplasm of colon: Secondary | ICD-10-CM

## 2024-07-28 DIAGNOSIS — I10 Essential (primary) hypertension: Secondary | ICD-10-CM | POA: Diagnosis not present

## 2024-07-28 DIAGNOSIS — Z800 Family history of malignant neoplasm of digestive organs: Secondary | ICD-10-CM

## 2024-07-28 MED ORDER — SODIUM CHLORIDE 0.9 % IV SOLN: 500.0000 mL | INTRAVENOUS | Status: DC

## 2024-07-28 NOTE — Progress Notes (Signed)
 GASTROENTEROLOGY PROCEDURE H&P NOTE   Primary Care Physician: Sun, Vyvyan, MD    Reason for Procedure:  Personal history of adenomatous colon polyps and family history of colon cancer  Plan:    Colonoscopy  Patient is appropriate for endoscopic procedure(s) in the ambulatory ([facility]) setting.  The nature of the procedure, as well as the risks, benefits, and alternatives were carefully and thoroughly reviewed with the patient. Ample time for discussion and questions allowed. The patient understood, was satisfied, and agreed to proceed.     HPI: Rachel Hawkins is a 63 y.o. female who presents for surveillance colonoscopy.  Medical history as below.  Tolerated the prep.  No recent chest pain or shortness of breath.  No abdominal pain today.  Past Medical History:  Diagnosis Date   Arthritis    GERD (gastroesophageal reflux disease)    Gestational diabetes    DIET   History of colon polyps    Hyperlipidemia    Hypertension    IgA nephropathy determined by renal biopsy    IgA nephropathy determined by renal biopsy    IgA nephropathy with benign hematuria    IgA nephropathy with benign hematuria     Past Surgical History:  Procedure Laterality Date   CESAREAN SECTION     CESAREAN SECTION     COLONOSCOPY      Prior to Admission medications   Medication Sig Start Date End Date Taking? Authorizing Provider  FIBER GUMMIES PO Take by mouth.   Yes [provider]  ascorbic Acid (VITAMIN C) 500 MG CPCR Take 500 mg by mouth daily.    [provider]  Cholecalciferol (VITAMIN D3) 25 MCG (1000 UT) CAPS Take 1 capsule by mouth daily.    [provider]  cyanocobalamin (VITAMIN B12) 500 MCG tablet Take 500 mcg by mouth daily.    [provider]  glucose blood (FREESTYLE LITE) test strip Use as directed to check blood sugar twice a day. 06/08/23     ibuprofen (ADVIL,MOTRIN) 200 MG tablet Take 400 mg by mouth every 6 (six) hours as needed. Patient  not taking: Reported on 07/14/2024    [provider]  Lancets (FREESTYLE) lancets Use to check blood sugar twice daily 06/09/24   Sun, Vyvyan, MD  lisinopril  (ZESTRIL ) 2.5 MG tablet Take 1 tablet by mouth every day Patient not taking: Reported on 07/14/2024 04/01/22     metFORMIN (GLUCOPHAGE) 500 MG tablet TAKE ONE TABLET BY MOUTH ONCE DAILY WITH A MEAL Patient not taking: Reported on 07/14/2024 06/12/20 06/12/21  Scifres, Naomie, PA-C  naproxen  (NAPROSYN ) 500 MG tablet Take 1 tablet by mouth every 12 hours with food or milk as needed for 5 days Patient not taking: Reported on 07/14/2024 05/18/22     pantoprazole  (PROTONIX ) 40 MG tablet Take 1 tablet (40 mg total) by mouth daily. Patient not taking: Reported on 07/14/2024 07/30/22     simvastatin  (ZOCOR ) 40 MG tablet Take 1 tablet (40 mg total) by mouth every evening. Patient not taking: Reported on 07/14/2024 03/24/23     Vitamin E 670 MG (1000 UT) CAPS Take 1,000 Units by mouth daily.    [provider]    Current Outpatient Medications  Medication Sig Dispense Refill   FIBER GUMMIES PO Take by mouth.     ascorbic Acid (VITAMIN C) 500 MG CPCR Take 500 mg by mouth daily.     Cholecalciferol (VITAMIN D3) 25 MCG (1000 UT) CAPS Take 1 capsule by mouth daily.  cyanocobalamin (VITAMIN B12) 500 MCG tablet Take 500 mcg by mouth daily.     glucose blood (FREESTYLE LITE) test strip Use as directed to check blood sugar twice a day. 200 strip 1   ibuprofen (ADVIL,MOTRIN) 200 MG tablet Take 400 mg by mouth every 6 (six) hours as needed. (Patient not taking: Reported on 07/14/2024)     Lancets (FREESTYLE) lancets Use to check blood sugar twice daily 100 each 1   lisinopril  (ZESTRIL ) 2.5 MG tablet Take 1 tablet by mouth every day (Patient not taking: Reported on 07/14/2024) 90 tablet 1   metFORMIN (GLUCOPHAGE) 500 MG tablet TAKE ONE TABLET BY MOUTH ONCE DAILY WITH A MEAL (Patient not taking: Reported on 07/14/2024) 30 tablet 5   naproxen   (NAPROSYN ) 500 MG tablet Take 1 tablet by mouth every 12 hours with food or milk as needed for 5 days (Patient not taking: Reported on 07/14/2024) 10 tablet 0   pantoprazole  (PROTONIX ) 40 MG tablet Take 1 tablet (40 mg total) by mouth daily. (Patient not taking: Reported on 07/14/2024) 30 tablet 0   simvastatin  (ZOCOR ) 40 MG tablet Take 1 tablet (40 mg total) by mouth every evening. (Patient not taking: Reported on 07/14/2024) 90 tablet 3   Vitamin E 670 MG (1000 UT) CAPS Take 1,000 Units by mouth daily.     Current Facility-Administered Medications  Medication Dose Route Frequency Provider Last Rate Last Admin   0.9 %  sodium chloride  infusion  500 mL Intravenous Continuous Dashonda Bonneau, Gordy HERO, MD        Allergies as of 07/28/2024 - Review Complete 07/28/2024  Allergen Reaction Noted   Ciprofloxacin Nausea And Vomiting 09/03/2011    Family History  Problem Relation Age of Onset   Hypertension Father    Diabetes Father    Heart disease Father    Cancer Sister    Colon cancer Sister 45   Esophageal cancer Neg Hx    Rectal cancer Neg Hx    Stomach cancer Neg Hx    Colon polyps Neg Hx     Social History   Socioeconomic History   Marital status: Married    Spouse name: Not on file   Number of children: Not on file   Years of education: Not on file   Highest education level: Not on file  Occupational History   Not on file  Tobacco Use   Smoking status: Former   Smokeless tobacco: Never  Vaping Use   Vaping status: Never Used  Substance and Sexual Activity   Alcohol use: No   Drug use: No   Sexual activity: Not on file  Other Topics Concern   Not on file  Social History Narrative   Not on file   Social Drivers of Health   Financial Resource Strain: Not on file  Food Insecurity: Not on file  Transportation Needs: Not on file  Physical Activity: Not on file  Stress: Not on file  Social Connections: Not on file  Intimate Partner Violence: Not on file    Physical  Exam: Vital signs in last 24 hours: @BP  119/76   Pulse 88   Temp 97.8 F (36.6 C) (Temporal)   Ht 4' 11 (1.499 m)   Wt 131 lb (59.4 kg)   LMP 03/25/2012   SpO2 98%   BMI 26.46 kg/m  GEN: NAD EYE: Sclerae anicteric ENT: MMM CV: Non-tachycardic Pulm: CTA b/l GI: Soft, NT/ND NEURO:  Alert & Oriented x 3   Gordy Starch, MD [facility]  07/28/2024 10:41 AM

## 2024-07-28 NOTE — Patient Instructions (Signed)
 Handouts given: Diverticulosis, Hemorrhoids Resume previous diet. Continue present medications.  Repeat colonoscopy in 5 years for surveillance.  YOU HAD AN ENDOSCOPIC PROCEDURE TODAY AT THE [facility]:   Refer to the procedure report that was given to you for any specific questions about what was found during the examination.  If the procedure report does not answer your questions, please call your gastroenterologist to clarify.  If you requested that your care partner not be given the details of your procedure findings, then the procedure report has been included in a sealed envelope for you to review at your convenience later.  YOU SHOULD EXPECT: Some feelings of bloating in the abdomen. Passage of more gas than usual.  Walking can help get rid of the air that was put into your GI tract during the procedure and reduce the bloating. If you had a lower endoscopy (such as a colonoscopy or flexible sigmoidoscopy) you may notice spotting of blood in your stool or on the toilet paper. If you underwent a bowel prep for your procedure, you may not have a normal bowel movement for a few days.  Please Note:  You might notice some irritation and congestion in your nose or some drainage.  This is from the oxygen used during your procedure.  There is no need for concern and it should clear up in a day or so.  SYMPTOMS TO REPORT IMMEDIATELY:  Following lower endoscopy (colonoscopy or flexible sigmoidoscopy):  Excessive amounts of blood in the stool  Significant tenderness or worsening of abdominal pains  Swelling of the abdomen that is new, acute  Fever of 100F or higher   For urgent or emergent issues, a gastroenterologist can be reached at any hour by calling [phone]. Do not use MyChart messaging for urgent concerns.    DIET:  We do recommend a small meal at first, but then you may proceed to your regular diet.  Drink plenty of fluids but you should avoid alcoholic beverages for  24 hours.  ACTIVITY:  You should plan to take it easy for the rest of today and you should NOT DRIVE or use heavy machinery until tomorrow (because of the sedation medicines used during the test).    FOLLOW UP: Our staff will call the number listed on your records the next business day following your procedure.  We will call around 7:15- 8:00 am to check on you and address any questions or concerns that you may have regarding the information given to you following your procedure. If we do not reach you, we will leave a message.     If any biopsies were taken you will be contacted by phone or by letter within the next 1-3 weeks.  Please call us  at [phone] if you have not heard about the biopsies in 3 weeks.    SIGNATURES/CONFIDENTIALITY: You and/or your care partner have signed paperwork which will be entered into your electronic medical record.  These signatures attest to the fact that that the information above on your After Visit Summary has been reviewed and is understood.  Full responsibility of the confidentiality of this discharge information lies with you and/or your care-partner.

## 2024-07-28 NOTE — Progress Notes (Signed)
 Pt's states no medical or surgical changes since previsit or office visit.

## 2024-07-28 NOTE — Progress Notes (Signed)
 Sedate, gd SR, tolerated procedure well, VSS, report to RN

## 2024-07-28 NOTE — Op Note (Addendum)
 [facility] Patient Name: Rachel Hawkins Procedure Date: 07/28/2024 10:45 AM MRN: [id number] Endoscopist: Gordy CHRISTELLA Starch , MD, [id number] Age: 63 Referring MD:  Date of Birth: 1961/07/04 Gender: Female Account #: [id number] Procedure:                Colonoscopy Indications:              Surveillance: Personal history of adenomatous                            polyps on last colonoscopy 5 years ago, Family                            history of colon cancer in a first-degree relative                            (sister), Last colonoscopy: October 2020 (TA x 2) Medicines:                Monitored Anesthesia Care Procedure:                Pre-Anesthesia Assessment:                           - Prior to the procedure, a History and Physical                            was performed, and patient medications and                            allergies were reviewed. The patient's tolerance of                            previous anesthesia was also reviewed. The risks                            and benefits of the procedure and the sedation                            options and risks were discussed with the patient.                            All questions were answered, and informed consent                            was obtained. Prior Anticoagulants: The patient has                            taken no anticoagulant or antiplatelet agents. ASA                            Grade Assessment: II - A patient with mild systemic                            disease. After reviewing the risks and benefits,  the patient was deemed in satisfactory condition to                            undergo the procedure.                           After obtaining informed consent, the colonoscope                            was passed under direct vision. Throughout the                            procedure, the patient's blood pressure, pulse, and                            oxygen  saturations were monitored continuously. The                            [id number]-HQ190L Colonoscope [id number] was introduced                            through the anus and advanced to the terminal                            ileum. The colonoscopy was performed without                            difficulty. The patient tolerated the procedure                            well. The quality of the bowel preparation was                            good. The terminal ileum, ileocecal valve,                            appendiceal orifice, and rectum were photographed. Scope In: 10:54:52 AM Scope Out: 11:10:51 AM Scope Withdrawal Time: 0 hours 12 minutes 35 seconds  Total Procedure Duration: 0 hours 15 minutes 59 seconds  Findings:                 The digital rectal exam was normal.                           The terminal ileum appeared normal.                           Multiple medium-mouthed and small-mouthed                            diverticula were found in the sigmoid colon,                            descending colon, proximal transverse colon and  hepatic flexure.                           Internal hemorrhoids were found during                            retroflexion. The hemorrhoids were small. Complications:            No immediate complications. Estimated Blood Loss:     Estimated blood loss: none. Impression:               - The examined portion of the ileum was normal.                           - Moderate diverticulosis in the sigmoid colon, in                            the descending colon, in the proximal transverse                            colon and at the hepatic flexure.                           - Small internal hemorrhoids.                           - No specimens collected. Recommendation:           - Patient has a contact number available for                            emergencies. The signs and symptoms of potential                            delayed  complications were discussed with the                            patient. Return to normal activities tomorrow.                            Written discharge instructions were provided to the                            patient.                           - Resume previous diet.                           - Continue present medications.                           - Repeat colonoscopy in 5 years for surveillance. Gordy CHRISTELLA Starch, MD 07/28/2024 11:15:03 AM This report has been signed electronically.

## 2024-07-31 ENCOUNTER — Telehealth: Payer: Self-pay | Admitting: *Deleted

## 2024-07-31 NOTE — Telephone Encounter (Signed)
  Follow up Call-     07/28/2024    9:42 AM  Call back number  Post procedure Call Back phone  # [phone]  Permission to leave phone message Yes   Left message to call back if any questions or concerns

## 2024-08-02 ENCOUNTER — Other Ambulatory Visit (HOSPITAL_BASED_OUTPATIENT_CLINIC_OR_DEPARTMENT_OTHER): Payer: Self-pay

## 2024-08-02 DIAGNOSIS — B349 Viral infection, unspecified: Secondary | ICD-10-CM | POA: Diagnosis not present

## 2024-08-02 DIAGNOSIS — U071 COVID-19: Secondary | ICD-10-CM | POA: Diagnosis not present

## 2024-08-02 MED ORDER — PAXLOVID (300/100) 20 X 150 MG & 10 X 100MG PO TBPK
ORAL_TABLET | ORAL | 0 refills | Status: AC
  Filled 2024-08-02: qty 30, 5d supply, fill #0

## 2024-08-03 ENCOUNTER — Other Ambulatory Visit (HOSPITAL_BASED_OUTPATIENT_CLINIC_OR_DEPARTMENT_OTHER): Payer: Self-pay

## 2024-08-04 ENCOUNTER — Other Ambulatory Visit (HOSPITAL_BASED_OUTPATIENT_CLINIC_OR_DEPARTMENT_OTHER): Payer: Self-pay
